# Patient Record
Sex: Female | Born: 1995 | Race: Black or African American | Hispanic: No | Marital: Single | Smoking: Never smoker
Health system: Southern US, Community
[De-identification: ages and names within clinical notes are randomized; demographics above are authoritative.]

## PROBLEM LIST (undated history)

## (undated) ENCOUNTER — Inpatient Hospital Stay (HOSPITAL_COMMUNITY): Payer: PRIVATE HEALTH INSURANCE

## (undated) DIAGNOSIS — O24419 Gestational diabetes mellitus in pregnancy, unspecified control: Secondary | ICD-10-CM

## (undated) DIAGNOSIS — E119 Type 2 diabetes mellitus without complications: Secondary | ICD-10-CM

## (undated) HISTORY — PX: WISDOM TOOTH EXTRACTION: SHX21

## (undated) HISTORY — PX: MOUTH SURGERY: SHX715

---

## 2015-06-28 ENCOUNTER — Encounter (HOSPITAL_COMMUNITY): Payer: Self-pay | Admitting: Emergency Medicine

## 2015-06-28 ENCOUNTER — Emergency Department (HOSPITAL_COMMUNITY)
Admission: EM | Admit: 2015-06-28 | Discharge: 2015-06-28 | Disposition: A | Discharge status: Home / Self Care | Payer: No Typology Code available for payment source | Attending: Emergency Medicine | Admitting: Emergency Medicine

## 2015-06-28 DIAGNOSIS — S199XXA Unspecified injury of neck, initial encounter: Secondary | ICD-10-CM | POA: Insufficient documentation

## 2015-06-28 DIAGNOSIS — M545 Low back pain, unspecified: Secondary | ICD-10-CM

## 2015-06-28 DIAGNOSIS — S3992XA Unspecified injury of lower back, initial encounter: Secondary | ICD-10-CM | POA: Insufficient documentation

## 2015-06-28 DIAGNOSIS — Y9389 Activity, other specified: Secondary | ICD-10-CM | POA: Insufficient documentation

## 2015-06-28 DIAGNOSIS — Y92410 Unspecified street and highway as the place of occurrence of the external cause: Secondary | ICD-10-CM | POA: Diagnosis not present

## 2015-06-28 DIAGNOSIS — Y998 Other external cause status: Secondary | ICD-10-CM | POA: Insufficient documentation

## 2015-06-28 DIAGNOSIS — S299XXA Unspecified injury of thorax, initial encounter: Secondary | ICD-10-CM | POA: Insufficient documentation

## 2015-06-28 MED ORDER — METHOCARBAMOL 500 MG PO TABS: 500.0000 mg | ORAL_TABLET | Freq: Two times a day (BID) | ORAL | Status: DC

## 2015-06-28 MED ORDER — IBUPROFEN 800 MG PO TABS: 800.0000 mg | ORAL_TABLET | Freq: Three times a day (TID) | ORAL | Status: DC
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
## 2015-06-28 NOTE — ED Notes (Signed)
Declined W/C at D/C and was escorted to lobby by RN. 
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
## 2018-06-22 ENCOUNTER — Ambulatory Visit: Payer: Self-pay | Admitting: Family Medicine

## 2018-06-29 ENCOUNTER — Other Ambulatory Visit: Payer: Self-pay | Admitting: Obstetrics and Gynecology

## 2018-06-29 DIAGNOSIS — R947 Abnormal results of other endocrine function studies: Secondary | ICD-10-CM

## 2018-07-07 ENCOUNTER — Ambulatory Visit
Admission: RE | Admit: 2018-07-07 | Discharge: 2018-07-07 | Disposition: A | Discharge status: Home / Self Care | Payer: PRIVATE HEALTH INSURANCE | Source: Ambulatory Visit | Attending: Obstetrics and Gynecology | Admitting: Obstetrics and Gynecology

## 2018-07-07 DIAGNOSIS — R947 Abnormal results of other endocrine function studies: Secondary | ICD-10-CM

## 2018-07-07 MED ORDER — GADOBENATE DIMEGLUMINE 529 MG/ML IV SOLN
7.0000 mL | Freq: Once | INTRAVENOUS | Status: AC | PRN
  Administered 2018-07-07: 7 mL via INTRAVENOUS

## 2019-07-21 IMAGING — MR MR HEAD WO/W CM
14 of 19 series · 34 of 48 positions shown · IV contrast (multihance)
Comparison: None.

CLINICAL DATA: Initial evaluation for elevated prolactin levels for
several years.

EXAM:
MRI HEAD WITHOUT AND WITH CONTRAST
TECHNIQUE: Multiplanar, multiecho pulse sequences of the brain and surrounding
structures were obtained without and with intravenous contrast. A
pituitary protocol was utilized.
CONTRAST:  7mL MULTIHANCE GADOBENATE DIMEGLUMINE 529 MG/ML IV SOLN

[Series 2: T1 · sagittal · 5.0mm · 0.45mm/px · 2 of 24 slices shown]
[im 1/24]
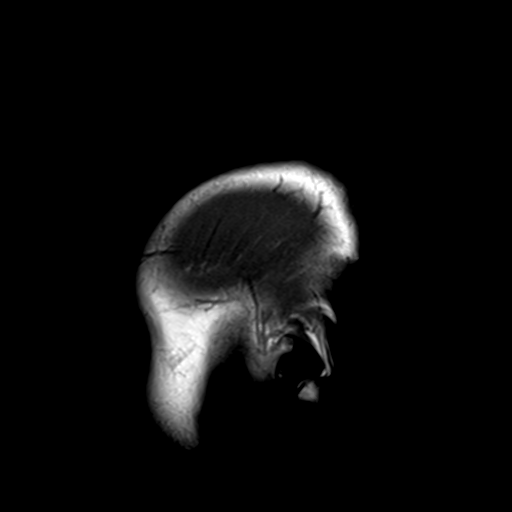
[im 24/24]
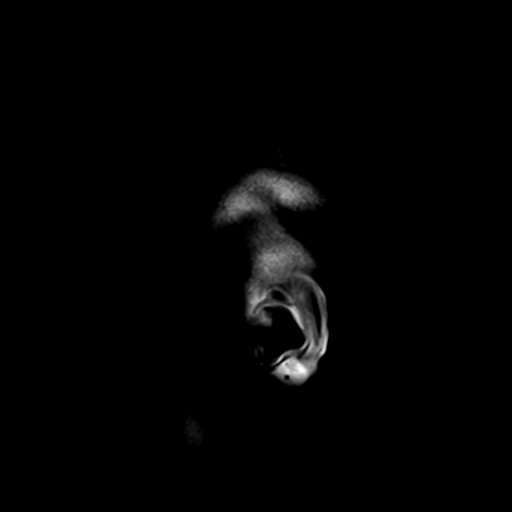

[Series 3: DWI · axial · 3.0mm · 1.80mm/px · z∈[-94,+51]mm · 11 of 100 slices shown]
[im 1/100]
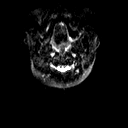
[im 10/100]
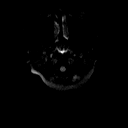
[im 20/100]
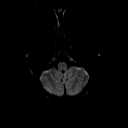
[im 30/100]
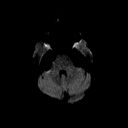
[im 40/100]
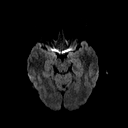
[im 50/100]
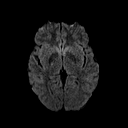
[im 60/100]
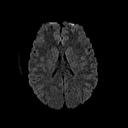
[im 70/100]
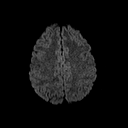
[im 80/100]
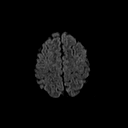
[im 90/100]
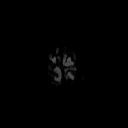
[im 100/100]
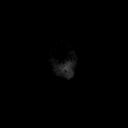

[Series 4: dwi_adc · axial · 3.0mm · 1.80mm/px · z∈[-94,+51]mm · 5 of 50 slices shown]
[im 1/50]
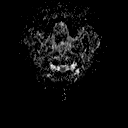
[im 13/50]
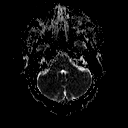
[im 25/50]
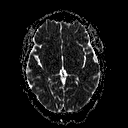
[im 37/50]
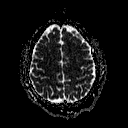
[im 50/50]
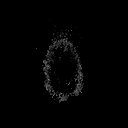

[Series 5: T2 · axial · 5.0mm · 0.36mm/px · z∈[-86,+49]mm · 2 of 22 slices shown]
[im 1/22]
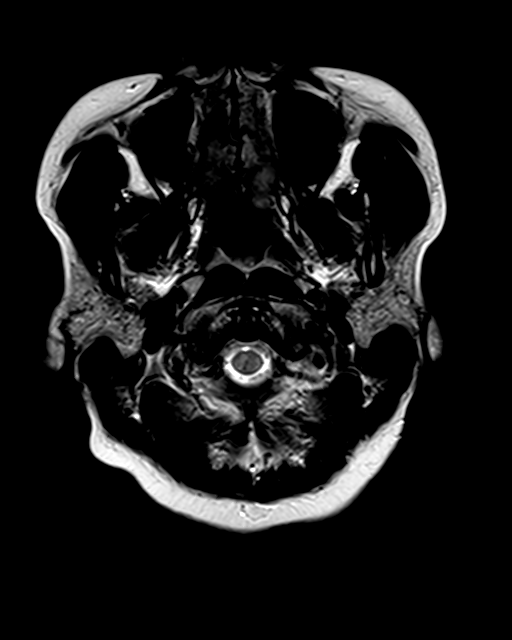
[im 22/22]
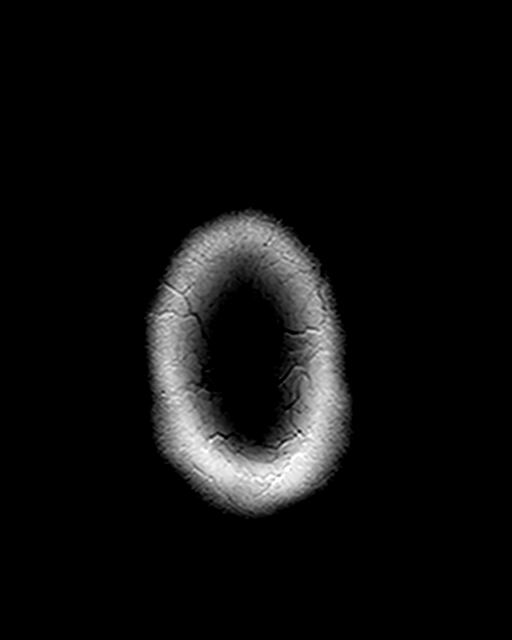

[Series 6: FLAIR · axial · 3.0mm · 0.45mm/px · z∈[-90,+52]mm · 3 of 32 slices shown]
[im 1/32]
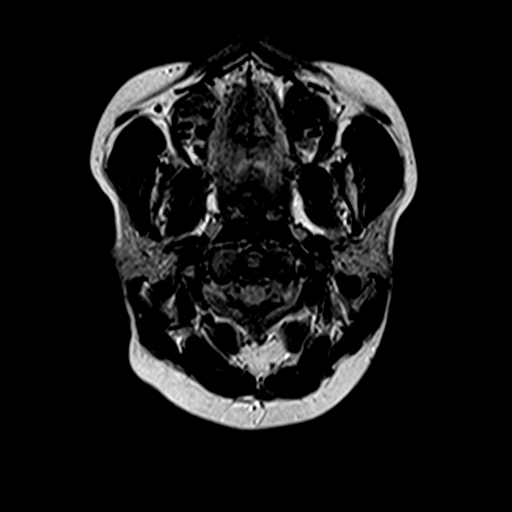
[im 16/32]
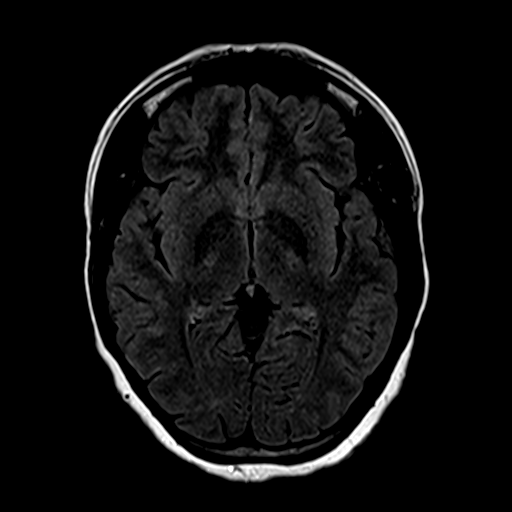
[im 32/32]
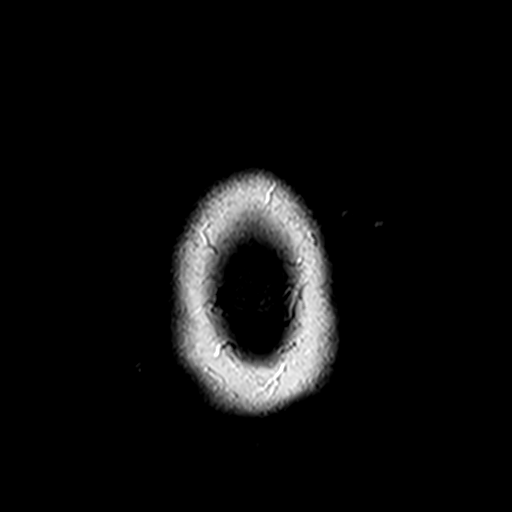

[Series 7: axial grad (blood) · axial · 5.0mm · 0.45mm/px · z∈[-92,+55]mm · 3 of 24 slices shown]
[im 1/24]
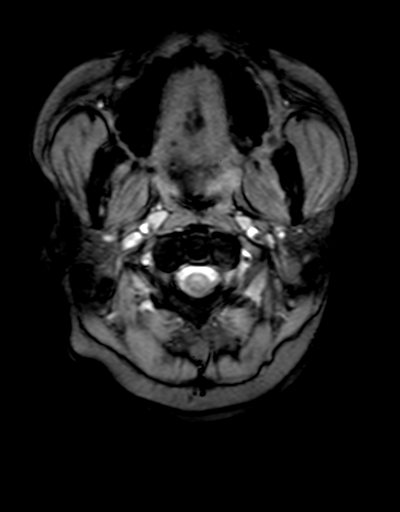
[im 12/24]
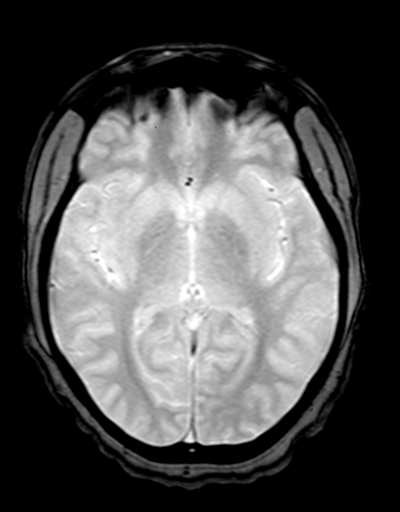
[im 24/24]
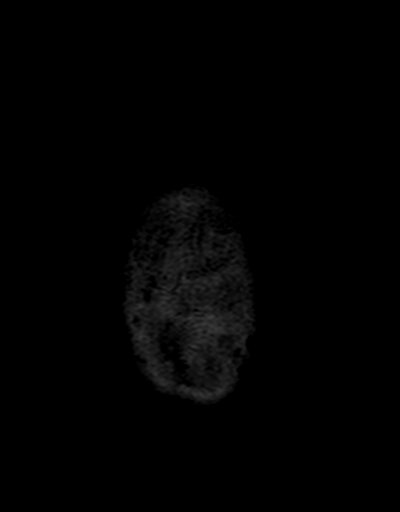

[Series 8: sag 3mm · sagittal · 3.0mm · 0.33mm/px · 1 of 11 slices shown]
[im 1/11]
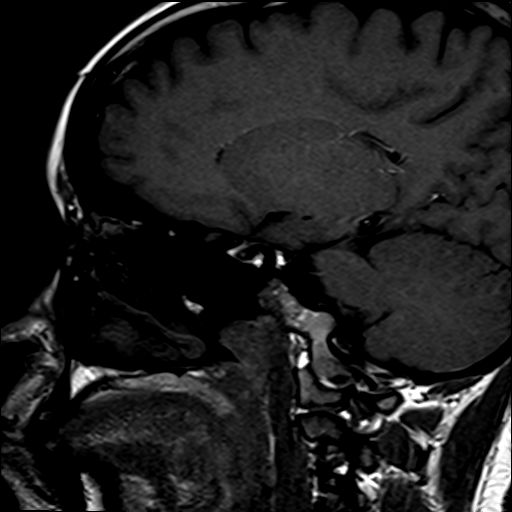

[Series 9: cor 3mm · coronal · 3.0mm · 0.33mm/px · 1 of 11 slices shown]
[im 1/11]
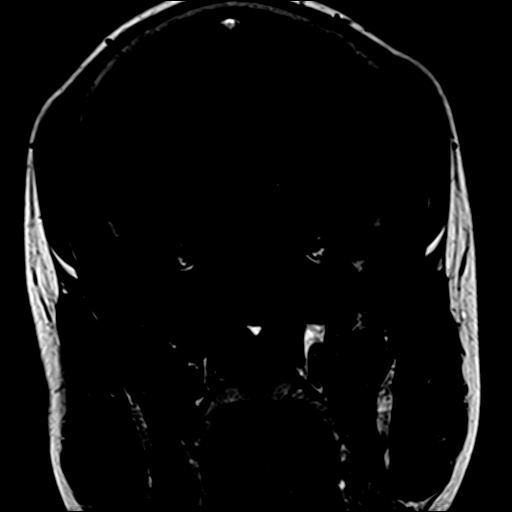

[Series 10: pre cor dynamic · coronal · non-contrast · 3.0mm · 0.35mm/px · 1 of 7 slices shown]
[im 1/7]
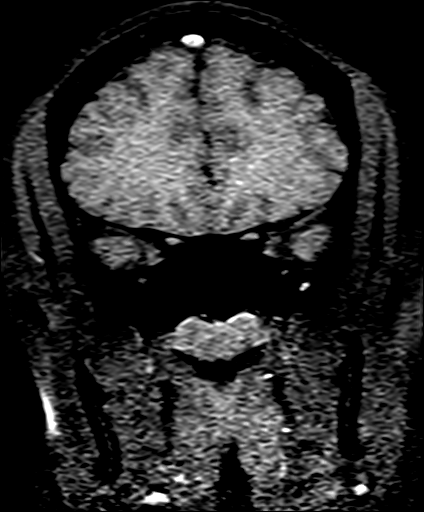

[Series 11: post fs cor · coronal · 3.0mm · 0.35mm/px · 1 of 7 slices shown (1 of 5)]
[im 1/7]
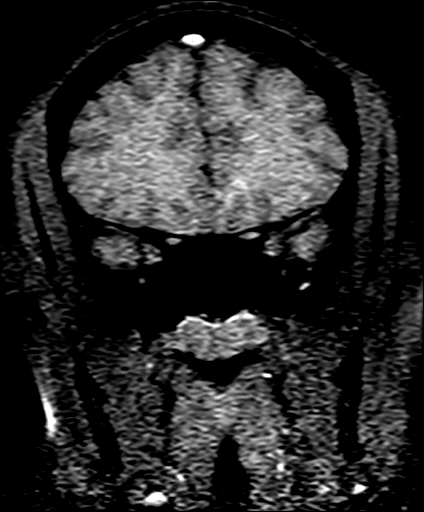

[Series 12: post fs cor · coronal · 3.0mm · 0.35mm/px · 1 of 7 slices shown (2 of 5)]
[im 1/7]
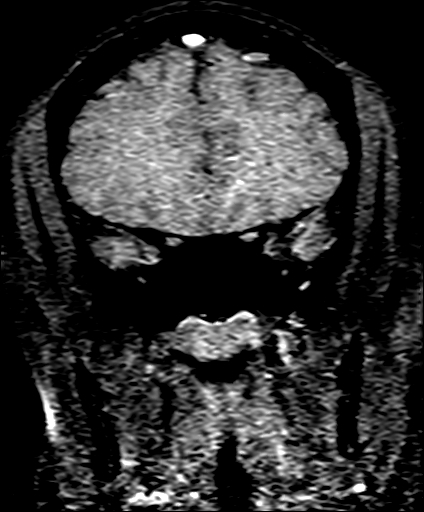

[Series 13: post fs cor · coronal · 3.0mm · 0.35mm/px · 1 of 7 slices shown (3 of 5)]
[im 1/7]
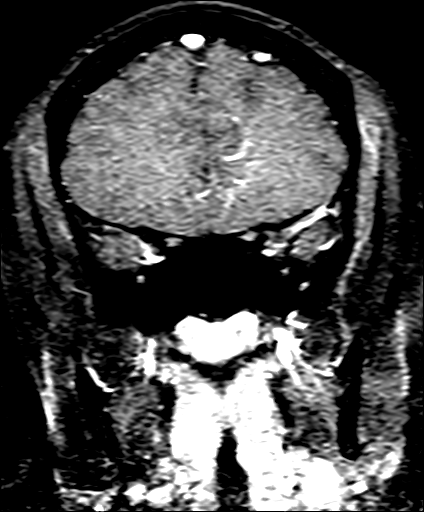

[Series 14: post fs cor · coronal · 3.0mm · 0.35mm/px · 1 of 7 slices shown (4 of 5)]
[im 1/7]
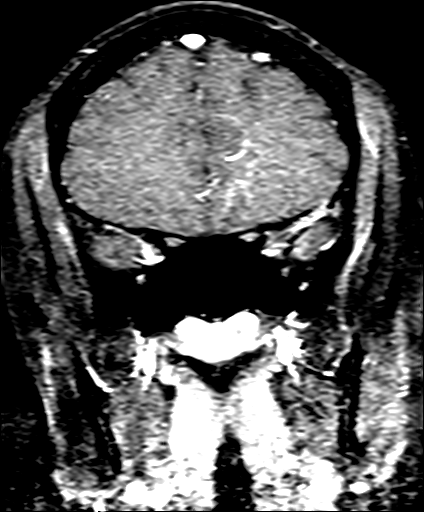

[Series 15: post fs cor · coronal · 3.0mm · 0.35mm/px · 1 of 7 slices shown (5 of 5)]
[im 1/7]
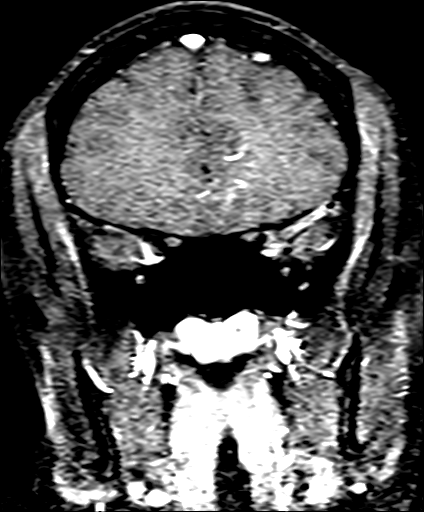

[34 of 48 positions shown; findings below may reference images not displayed]

FINDINGS: Brain: Cerebral volume normal for patient age. No focal parenchymal
signal abnormality identified. No evidence for acute or subacute
infarct. Gray-white matter differentiation well maintained. No
encephalomalacia to suggest chronic infarction. No foci of
susceptibility artifact to suggest acute or chronic intracranial
hemorrhage.

No mass lesion, midline shift or mass effect. No hydrocephalus. No
extra-axial fluid collection. No abnormal enhancement within the
brain.

Postcontrast dynamic imaging through the pituitary gland and sella
was performed. Pituitary bright spot normally position. Pituitary
gland itself demonstrates normal size and morphology. No areas of
relative hypoenhancement to suggest adenoma. Pituitary stalk midline
and intact. Optic chiasm normally situated within the suprasellar
cistern. No abnormality within the adjacent cavernous sinus.

Vascular: Normal intravascular flow voids seen within the
intracranial circulation.

Skull and upper cervical spine: Craniocervical junction normal.
Visual upper cervical spine unremarkable. Bone marrow signal
intensity within normal limits. No scalp soft tissue abnormality.

Sinuses/Orbits: Globes and orbital soft tissues within normal
limits. Paranasal sinuses and mastoid air cells are clear. Inner ear
structures grossly normal.

Other: None.
IMPRESSION: Normal pituitary protocol MRI of the brain.

## 2019-07-26 ENCOUNTER — Other Ambulatory Visit: Payer: Self-pay

## 2019-07-26 DIAGNOSIS — Z20822 Contact with and (suspected) exposure to covid-19: Secondary | ICD-10-CM

## 2019-07-27 LAB — NOVEL CORONAVIRUS, NAA: SARS-CoV-2, NAA: NOT DETECTED

## 2019-08-22 ENCOUNTER — Other Ambulatory Visit: Payer: Self-pay | Admitting: *Deleted

## 2020-07-16 LAB — OB RESULTS CONSOLE HEPATITIS B SURFACE ANTIGEN: Hepatitis B Surface Ag: NEGATIVE

## 2020-07-16 LAB — OB RESULTS CONSOLE GC/CHLAMYDIA
Chlamydia: NEGATIVE
Gonorrhea: NEGATIVE

## 2020-07-16 LAB — OB RESULTS CONSOLE HIV ANTIBODY (ROUTINE TESTING): HIV: NONREACTIVE

## 2020-07-16 LAB — OB RESULTS CONSOLE ABO/RH: RH Type: POSITIVE

## 2020-07-16 LAB — OB RESULTS CONSOLE RUBELLA ANTIBODY, IGM: Rubella: IMMUNE

## 2020-07-16 LAB — OB RESULTS CONSOLE ANTIBODY SCREEN: Antibody Screen: NEGATIVE

## 2020-07-16 LAB — OB RESULTS CONSOLE RPR: RPR: NONREACTIVE

## 2020-09-04 ENCOUNTER — Inpatient Hospital Stay (HOSPITAL_COMMUNITY)
Admission: AD | Admit: 2020-09-04 | Discharge: 2020-09-05 | Disposition: A | Discharge status: Home / Self Care | Payer: BC Managed Care – PPO | Attending: Obstetrics & Gynecology | Admitting: Obstetrics & Gynecology

## 2020-09-04 ENCOUNTER — Other Ambulatory Visit: Payer: Self-pay

## 2020-09-04 DIAGNOSIS — J029 Acute pharyngitis, unspecified: Secondary | ICD-10-CM | POA: Diagnosis present

## 2020-09-04 DIAGNOSIS — Z3A13 13 weeks gestation of pregnancy: Secondary | ICD-10-CM | POA: Diagnosis not present

## 2020-09-04 DIAGNOSIS — O98511 Other viral diseases complicating pregnancy, first trimester: Secondary | ICD-10-CM | POA: Insufficient documentation

## 2020-09-04 DIAGNOSIS — U071 COVID-19: Secondary | ICD-10-CM | POA: Insufficient documentation

## 2020-09-04 LAB — CBC
HCT: 32.2 % — ABNORMAL LOW (ref 36.0–46.0)
Hemoglobin: 10.9 g/dL — ABNORMAL LOW (ref 12.0–15.0)
MCH: 28.2 pg (ref 26.0–34.0)
MCHC: 33.9 g/dL (ref 30.0–36.0)
MCV: 83.2 fL (ref 80.0–100.0)
Platelets: 274 10*3/uL (ref 150–400)
RBC: 3.87 MIL/uL (ref 3.87–5.11)
RDW: 13 % (ref 11.5–15.5)
WBC: 5.8 10*3/uL (ref 4.0–10.5)
nRBC: 0 % (ref 0.0–0.2)

## 2020-09-04 LAB — COMPREHENSIVE METABOLIC PANEL
ALT: 16 U/L (ref 0–44)
AST: 20 U/L (ref 15–41)
Albumin: 3.5 g/dL (ref 3.5–5.0)
Alkaline Phosphatase: 48 U/L (ref 38–126)
Anion gap: 9 (ref 5–15)
BUN: 5 mg/dL — ABNORMAL LOW (ref 6–20)
CO2: 22 mmol/L (ref 22–32)
Calcium: 9.4 mg/dL (ref 8.9–10.3)
Chloride: 102 mmol/L (ref 98–111)
Creatinine, Ser: 0.63 mg/dL (ref 0.44–1.00)
GFR, Estimated: >60 mL/min (ref >60)
Glucose, Bld: 98 mg/dL (ref 70–99)
Potassium: 3.8 mmol/L (ref 3.5–5.1)
Sodium: 133 mmol/L — ABNORMAL LOW (ref 135–145)
Total Bilirubin: 0.1 mg/dL — ABNORMAL LOW (ref 0.3–1.2)
Total Protein: 7.1 g/dL (ref 6.5–8.1)

## 2020-09-04 LAB — URINALYSIS, ROUTINE W REFLEX MICROSCOPIC
Bilirubin Urine: NEGATIVE
Glucose, UA: NEGATIVE mg/dL
Hgb urine dipstick: NEGATIVE
Ketones, ur: NEGATIVE mg/dL
Leukocytes,Ua: NEGATIVE
Nitrite: NEGATIVE
Protein, ur: NEGATIVE mg/dL
Specific Gravity, Urine: 1.006 (ref 1.005–1.030)
pH: 6 (ref 5.0–8.0)

## 2020-09-04 NOTE — MAU Note (Addendum)
PT SAYS LMP WAS 9-23- WAS CONFIRMED AT [facility] - NEXT APPOINTMENT 09-13-2020.    SAYS BACK HURTS- STARTED TODAY -DID NOT CALL DR.   SAYS THROAT HURTS, H/A , DIZZY - FEELS HOT.

## 2020-09-05 DIAGNOSIS — O98511 Other viral diseases complicating pregnancy, first trimester: Secondary | ICD-10-CM

## 2020-09-05 DIAGNOSIS — Z3A13 13 weeks gestation of pregnancy: Secondary | ICD-10-CM

## 2020-09-05 DIAGNOSIS — U071 COVID-19: Secondary | ICD-10-CM | POA: Diagnosis not present

## 2020-09-05 LAB — RESP PANEL BY RT-PCR (FLU A&B, COVID) ARPGX2
Influenza A by PCR: NEGATIVE
Influenza B by PCR: NEGATIVE
SARS Coronavirus 2 by RT PCR: POSITIVE — AB

## 2020-09-05 MED ORDER — DIPHENHYDRAMINE HCL 50 MG/ML IJ SOLN: 50.0000 mg | Freq: Once | INTRAMUSCULAR | Status: DC | PRN

## 2020-09-05 MED ORDER — FAMOTIDINE 20 MG IN NS 100 ML IVPB: 20.0000 mg | Freq: Once | INTRAVENOUS | Status: DC | PRN

## 2020-09-05 MED ORDER — EPINEPHRINE 0.3 MG/0.3ML IJ SOAJ
0.3000 mg | Freq: Once | INTRAMUSCULAR | Status: DC | PRN
  Filled 2020-09-05: qty 0.6

## 2020-09-05 MED ORDER — SODIUM CHLORIDE 0.9 % IV SOLN: INTRAVENOUS | Status: DC | PRN

## 2020-09-05 MED ORDER — SODIUM CHLORIDE 0.9 % IV SOLN
Freq: Once | INTRAVENOUS | Status: AC
  Filled 2020-09-05: qty 5

## 2020-09-05 MED ORDER — ALBUTEROL SULFATE HFA 108 (90 BASE) MCG/ACT IN AERS
2.0000 | INHALATION_SPRAY | Freq: Once | RESPIRATORY_TRACT | Status: DC | PRN
  Filled 2020-09-05: qty 6.7

## 2020-09-05 MED ORDER — METHYLPREDNISOLONE SODIUM SUCC 125 MG IJ SOLR: 125.0000 mg | Freq: Once | INTRAMUSCULAR | Status: DC | PRN

## 2020-09-05 NOTE — MAU Provider Note (Signed)
History     CSN: [id number]  Arrival date and time: 09/04/20 2011   Event Date/Time   First Provider Initiated Contact with Patient 09/05/20 0224      Chief Complaint  Patient presents with  . Sore Throat  . Dizziness   Amy Valenzuela is a 24 y.o. G1P0 at [redacted]w[redacted]d who presents to MAU with complaints of HA, back pain, sore throat and lightheadedness. Patient reports symptoms started occurring yesterday. Rates HA 10/10- has taken Tylenol for HA without relief. Denies any abdominal pain, vaginal bleeding, or discharge. Denies shortness of breath. Patient reports that she was vaccinated last dose June 2021.    OB History    Gravida  1   Para      Term      Preterm      AB      Living        SAB      IAB      Ectopic      Multiple      Live Births              No past medical history on file.  No past surgical history on file.  No family history on file.  Social History   Tobacco Use  . Smoking status: Never Smoker  Substance Use Topics  . Alcohol use: No    Allergies: Not on File  Medications Prior to Admission  Medication Sig Dispense Refill Last Dose  . ibuprofen (ADVIL,MOTRIN) 800 MG tablet Take 1 tablet (800 mg total) by mouth 3 (three) times daily. 21 tablet 0   . methocarbamol (ROBAXIN) 500 MG tablet Take 1 tablet (500 mg total) by mouth 2 (two) times daily. 20 tablet 0     Review of Systems  Constitutional: Negative.   HENT: Positive for sore throat. Negative for congestion.   Respiratory: Negative.   Cardiovascular: Negative.   Gastrointestinal: Negative.   Genitourinary: Negative.   Musculoskeletal: Negative.   Neurological: Positive for dizziness, light-headedness and headaches.       Near syncope   Physical Exam   Blood pressure 122/71, pulse (!) 111, temperature 100.1 F (37.8 C), temperature source Oral, resp. rate 18, height 5\' 2"  (1.575 m), weight 80.1 kg, last menstrual period 05/31/2020.  Physical Exam Vitals and nursing  note reviewed.  Constitutional:      General: She is not in acute distress.    Appearance: Normal appearance. She is normal weight.  HENT:     Head: Normocephalic.  Cardiovascular:     Rate and Rhythm: Normal rate and regular rhythm.  Pulmonary:     Effort: Pulmonary effort is normal. No respiratory distress.     Breath sounds: Normal breath sounds. No wheezing.  Abdominal:     Palpations: Abdomen is soft. There is no mass.     Tenderness: There is no abdominal tenderness. There is no guarding.  Skin:    General: Skin is warm and dry.  Neurological:     Mental Status: She is alert and oriented to person, place, and time.  Psychiatric:        Mood and Affect: Mood normal.        Behavior: Behavior normal.        Thought Content: Thought content normal.    FHR 169 by doppler   MAU Course  Procedures  MDM Orders Placed This Encounter  Procedures  . Resp Panel by RT-PCR (Flu A&B, Covid) Nasopharyngeal Swab  . Urinalysis, Routine w  reflex microscopic Urine, Clean Catch  . CBC  . Comprehensive metabolic panel  . Orthostatic vital signs  . Hypersensitivity GRADE 1: Transient flushing or rash, or drug fever < 100.4 F  . Hypersensitivity GRADE 2: Rash, flushing, urticaria, dyspnea, or drug fever = or > 100.4 F  . Hypersensitivity GRADE 3: symptomatic bronchospasm, with or without urticaria, parenteral medication management indicated, allergy-related edema/angioedema, or hypotension  . Hypersensitivity GRADE 4: Anaphylaxis  . Fetal monitoring  . Assess fetal heart tones  . Provide patient the appropriate monoclonal antibody fact sheet prior to administration  . casirivimab / imdevimab per pharmacy consult  . Airborne and Contact precautions  . Insert peripheral IV   After diagnoses of COVID and discussion of results with patient- patient request ultrasound to "check on baby"  Bedside US performed - Pt informed that the ultrasound is considered a limited OB ultrasound and is not  intended to be a complete ultrasound exam.  Patient also informed that the ultrasound is not being completed with the intent of assessing for fetal or placental anomalies or any pelvic abnormalities.  Explained that the purpose of today's ultrasound is to assess for  viability.  Patient acknowledges the purpose of the exam and the limitations of the study.    Fetal movement and heart beat showed to patient. Patient thanks provider for comfort.   Educated and discussed COVID diagnoses and option of receiving mAb infusion in MAU prior to discharge home. Discussed risks and benefits of infusion. Patient agrees to mAb and orders for administration placed.   Meds ordered this encounter  Medications  . casirivimab (REGN 10933) 600 mg, imdevimab (REGN 10987) 600 mg in sodium chloride 0.9 % 110 mL IVPB    Order Specific Question:   I attest that this patient meets the FDA Emergency Use Authorization criteria and has risk factor(s) for progression to severe COVID-19 and/or hospitalization:    Answer:   Yes    Order Specific Question:   High Risk Factor(s) for COVID-19 Progression:    Answer:   Pregnancy  . 0.9 %  sodium chloride infusion  . diphenhydrAMINE (BENADRYL) injection 50 mg  . famotidine (PEPCID) IVPB 20 mg in NS 100 mL IVPB  . methylPREDNISolone sodium succinate (SOLU-MEDROL) 125 mg/2 mL injection 125 mg  . albuterol (VENTOLIN HFA) 108 (90 Base) MCG/ACT inhaler 2 puff  . EPINEPHrine (EPI-PEN) injection 0.3 mg   Patient reassessed after mAb - patient denies HA at this time, no adverse reaction. Educated and discussed with patient to have friends/family that she has been in contact with tested for COVID. Quarantine for 10 days. Discussed safe medications during pregnancy.   Discussed reasons to return to MAU. Return to MAU as needed. Pt stable at time of discharge.   Assessment and Plan   1. COVID-19 affecting pregnancy in first trimester   2. [redacted] weeks gestation of pregnancy    Discharge  home Make initial prenatal appointment  Hydration, rest, and quarantine  Return to MAU as needed for reasons discussed and/or emergencies     Allergies as of 09/05/2020   No Known Allergies     Medication List    STOP taking these medications   ibuprofen 800 MG tablet Commonly known as: ADVIL   methocarbamol 500 MG tablet Commonly known as: ROBAXIN       Sharyon Cable CNM 09/05/2020, 7:08 AM

## 2020-09-05 NOTE — Discharge Instructions (Signed)
Safe Medications in Pregnancy   Backache/Headache: Tylenol: 2 regular strength every 4 hours OR              2 Extra strength every 6 hours  Colds/Coughs/Allergies: Benadryl (alcohol free) 25 mg every 6 hours as needed Breath right strips Claritin Cepacol throat lozenges Chloraseptic throat spray Cold-Eeze- up to three times per day Cough drops, alcohol free Flonase (by prescription only) Guaifenesin Mucinex Robitussin DM (plain only, alcohol free) Saline nasal spray/drops Sudafed (pseudoephedrine) & Actifed ** use only after [redacted] weeks gestation and if you do not have high blood pressure Tylenol Vicks Vaporub Zinc lozenges Zyrtec   Constipation: Colace Ducolax suppositories Fleet enema Glycerin suppositories Metamucil Milk of magnesia Miralax Senokot Smooth move tea  Diarrhea: Kaopectate Imodium A-D  *NO pepto Bismol  Nausea/Vomiting:  Bonine Dramamine Emetrol Ginger extract Sea bands Meclizine  Nausea medication to take during pregnancy:  Unisom (doxylamine succinate 25 mg tablets) Take one tablet daily at bedtime. If symptoms are not adequately controlled, the dose can be increased to a maximum recommended dose of two tablets daily (1/2 tablet in the morning, 1/2 tablet mid-afternoon and one at bedtime). Vitamin B6 100mg  tablets. Take one tablet twice a day (up to 200 mg per day).  **If taking multiple medications, please check labels to avoid duplicating the same active ingredients **take medication as directed on the label ** Do not exceed 4000 mg of tylenol in 24 hours **Do not take medications that contain aspirin or ibuprofen   10 Things You Can Do to Manage Your COVID-19 Symptoms at Home If you have possible or confirmed COVID-19: 1. Stay home from work and school. And stay away from other public places. If you must go out, avoid using any kind of public transportation, ridesharing, or taxis. 2. Monitor your symptoms carefully. If your symptoms get  worse, call your healthcare provider immediately. 3. Get rest and stay hydrated. 4. If you have a medical appointment, call the healthcare provider ahead of time and tell them that you have or may have COVID-19. 5. For medical emergencies, call 911 and notify the dispatch personnel that you have or may have COVID-19. 6. Cover your cough and sneezes with a tissue or use the inside of your elbow. 7. Wash your hands often with soap and water for at least 20 seconds or clean your hands with an alcohol-based hand sanitizer that contains at least 60% alcohol. 8. As much as possible, stay in a specific room and away from other people in your home. Also, you should use a separate bathroom, if available. If you need to be around other people in or outside of the home, wear a mask. 9. Avoid sharing personal items with other people in your household, like dishes, towels, and bedding. 10. Clean all surfaces that are touched often, like counters, tabletops, and doorknobs. Use household cleaning sprays or wipes according to the label instructions. 03/09/2019 This information is not intended to replace advice given to you by your health care provider. Make sure you discuss any questions you have with your health care provider. Document Revised: 08/11/2019 Document Reviewed: 08/11/2019 Elsevier Patient Education  2020 14/11/2018.

## 2021-01-16 ENCOUNTER — Other Ambulatory Visit: Payer: Self-pay

## 2021-01-16 ENCOUNTER — Encounter: Payer: BC Managed Care – PPO | Attending: Obstetrics and Gynecology | Admitting: Registered"

## 2021-01-16 DIAGNOSIS — O24419 Gestational diabetes mellitus in pregnancy, unspecified control: Secondary | ICD-10-CM | POA: Diagnosis present

## 2021-01-18 ENCOUNTER — Encounter: Payer: Self-pay | Admitting: Registered"

## 2021-01-18 DIAGNOSIS — O24419 Gestational diabetes mellitus in pregnancy, unspecified control: Secondary | ICD-10-CM | POA: Insufficient documentation

## 2021-01-18 NOTE — Progress Notes (Signed)
Patient was seen on 01/16/2021 for Gestational Diabetes self-management class [facility]. The following learning objectives were met by the patient during this course:   States the definition of Gestational Diabetes  States why dietary management is important in controlling blood glucose  Describes the effects each nutrient has on blood glucose levels  Demonstrates ability to create a balanced meal plan  Demonstrates carbohydrate counting   States when to check blood glucose levels  Demonstrates proper blood glucose monitoring techniques  States the effect of stress and exercise on blood glucose levels  States the importance of limiting caffeine and abstaining from alcohol and smoking  Blood glucose monitor given: Accu-chek Guide Me Lot #[id number] Exp: 01/11/2022 CBG: 93 mg/dL  Patient instructed to monitor glucose levels: FBS: 60 - <95; 1 hour: <140; 2 hour: <120  Patient received handouts:  Nutrition Diabetes and Pregnancy, including carb counting list  Patient will be seen for follow-up as needed.

## 2021-01-24 ENCOUNTER — Inpatient Hospital Stay (HOSPITAL_BASED_OUTPATIENT_CLINIC_OR_DEPARTMENT_OTHER): Payer: BC Managed Care – PPO

## 2021-01-24 ENCOUNTER — Other Ambulatory Visit: Payer: Self-pay

## 2021-01-24 ENCOUNTER — Inpatient Hospital Stay (HOSPITAL_COMMUNITY)
Admission: AD | Admit: 2021-01-24 | Discharge: 2021-01-24 | Disposition: A | Discharge status: Home / Self Care | Payer: BC Managed Care – PPO | Attending: Obstetrics & Gynecology | Admitting: Obstetrics & Gynecology

## 2021-01-24 ENCOUNTER — Encounter (HOSPITAL_COMMUNITY): Payer: Self-pay | Admitting: Obstetrics & Gynecology

## 2021-01-24 DIAGNOSIS — Z3A34 34 weeks gestation of pregnancy: Secondary | ICD-10-CM | POA: Diagnosis not present

## 2021-01-24 DIAGNOSIS — Z3689 Encounter for other specified antenatal screening: Secondary | ICD-10-CM

## 2021-01-24 DIAGNOSIS — O24419 Gestational diabetes mellitus in pregnancy, unspecified control: Secondary | ICD-10-CM | POA: Insufficient documentation

## 2021-01-24 DIAGNOSIS — O288 Other abnormal findings on antenatal screening of mother: Secondary | ICD-10-CM | POA: Diagnosis not present

## 2021-01-24 HISTORY — DX: Type 2 diabetes mellitus without complications: E11.9

## 2021-01-24 NOTE — MAU Provider Note (Addendum)
  History     CSN: [id number]  Arrival date and time: 01/24/21 1045   Event Date/Time   First Provider Initiated Contact with Patient 01/24/21 1219      Chief Complaint  Patient presents with  . Fetal Monitoring   HPI  Ms.Amy Valenzuela is a 25 y.o. female G1P0 @ [redacted]w[redacted]d here in MAU for further fetal monitoring. She was seen in the office today at [facility] and had an NST done which was non-reactive. She was sent here for longer monitoring. She reports good/ normal fetal movement currently.  Hx of GDM, she was started on oral medication today. She has not started this medication yet. No abdominal pain or vaginal bleeding.  She has no complaints.   OB History    Gravida  1   Para      Term      Preterm      AB      Living        SAB      IAB      Ectopic      Multiple      Live Births              Past Medical History:  Diagnosis Date  . Diabetes mellitus without complication [facility])     Past Surgical History:  Procedure Laterality Date  . MOUTH SURGERY    . WISDOM TOOTH EXTRACTION      No family history on file.  Social History   Tobacco Use  . Smoking status: Never Smoker  . Smokeless tobacco: Never Used  Substance Use Topics  . Alcohol use: No  . Drug use: Never    Allergies: No Known Allergies  Medications Prior to Admission  Medication Sig Dispense Refill Last Dose  . glipiZIDE (GLUCOTROL) 5 MG tablet Take 5 mg by mouth daily before breakfast.     . Prenatal Vit-Fe Fumarate-FA (PRENATAL MULTIVITAMIN) TABS tablet Take 1 tablet by mouth daily at 12 noon.   01/23/2021 at Unknown time   No results found for this or any previous visit (from the past 48 hour(s)).   No results found.  Review of Systems  Gastrointestinal: Negative for abdominal pain.  Genitourinary: Negative for vaginal bleeding and vaginal discharge.   Physical Exam   Blood pressure 121/68, pulse (!) 101, temperature 98 F (36.7 C), temperature source Oral, resp. rate 19, height  5\' 2"  (1.575 m), weight 90.6 kg, last menstrual period 05/31/2020, SpO2 99 %.  Physical Exam Constitutional:      General: She is not in acute distress.    Appearance: Normal appearance. She is not ill-appearing, toxic-appearing or diaphoretic.  HENT:     Head: Normocephalic.  Eyes:     Pupils: Pupils are equal, round, and reactive to light.  Neurological:     Mental Status: She is alert and oriented to person, place, and time.   Fetal Tracing: Baseline: 140 bpm Variability: Moderate  Accelerations: 15x15 Decelerations: None Toco: None MAU Course  Procedures  MDM  BPP 8/8 with reactive NST 10/10 Reviewed Fetal tracing with Dr. 06/02/2020    Assessment and Plan    A:  1. NST (non-stress test) reactive   2. Non-reactive NST (non-stress test)   3. [redacted] weeks gestation of pregnancy     P:  Discharge home in stable condition Kick counts reviewed F/u with [facility] Return to MAU if symptoms worsen   Ajdin Macke, March [place], NP 01/24/2021 1:45 PM

## 2021-01-24 NOTE — Discharge Instructions (Signed)
Fetal Movement Counts Patient Name: ________________________________________________ Patient Due Date: ____________________  What is a fetal movement count? A fetal movement count is the number of times that you feel your baby move during a certain amount of time. This may also be called a fetal kick count. A fetal movement count is recommended for every pregnant woman. You may be asked to start counting fetal movements as early as week 28 of your pregnancy. Pay attention to when your baby is most active. You may notice your baby's sleep and wake cycles. You may also notice things that make your baby move more. You should do a fetal movement count:  When your baby is normally most active.  At the same time each day. A good time to count movements is while you are resting, after having something to eat and drink. How do I count fetal movements? 1. Find a quiet, comfortable area. Sit, or lie down on your side. 2. Write down the date, the start time and stop time, and the number of movements that you felt between those two times. Take this information with you to your health care visits. 3. Write down your start time when you feel the first movement. 4. Count kicks, flutters, swishes, rolls, and jabs. You should feel at least 10 movements. 5. You may stop counting after you have felt 10 movements, or if you have been counting for 2 hours. Write down the stop time. 6. If you do not feel 10 movements in 2 hours, contact your health care provider for further instructions. Your health care provider may want to do additional tests to assess your baby's well-being. Contact a health care provider if:  You feel fewer than 10 movements in 2 hours.  Your baby is not moving like he or she usually does. Date: ____________ Start time: ____________ Stop time: ____________ Movements: ____________ Date: ____________ Start time: ____________ Stop time: ____________ Movements: ____________ Date: ____________  Start time: ____________ Stop time: ____________ Movements: ____________ Date: ____________ Start time: ____________ Stop time: ____________ Movements: ____________ Date: ____________ Start time: ____________ Stop time: ____________ Movements: ____________ Date: ____________ Start time: ____________ Stop time: ____________ Movements: ____________ Date: ____________ Start time: ____________ Stop time: ____________ Movements: ____________ Date: ____________ Start time: ____________ Stop time: ____________ Movements: ____________ Date: ____________ Start time: ____________ Stop time: ____________ Movements: ____________ This information is not intended to replace advice given to you by your health care provider. Make sure you discuss any questions you have with your health care provider. Document Revised: 04/14/2019 Document Reviewed: 04/14/2019 Elsevier Patient Education  2021 Elsevier Inc.  

## 2021-01-24 NOTE — MAU Note (Signed)
Presents with for fetal monitoring, reports had NST @ office today for GDM.  Denies VB or LOF.  Endorses +FM.

## 2021-01-25 DIAGNOSIS — Z3A34 34 weeks gestation of pregnancy: Secondary | ICD-10-CM | POA: Diagnosis not present

## 2021-01-25 DIAGNOSIS — O321XX0 Maternal care for breech presentation, not applicable or unspecified: Secondary | ICD-10-CM | POA: Diagnosis not present

## 2021-01-25 DIAGNOSIS — O288 Other abnormal findings on antenatal screening of mother: Secondary | ICD-10-CM

## 2021-02-11 ENCOUNTER — Other Ambulatory Visit: Payer: Self-pay | Admitting: Obstetrics and Gynecology

## 2021-02-21 NOTE — Patient Instructions (Signed)
Amy Valenzuela  02/21/2021   Your procedure is scheduled on:  03/01/2021  Arrive at 0730 at Entrance C on [employer] at [facility]  and [employer]. You are invited to use the FREE valet parking or use the Visitor's parking deck.  Pick up the phone at the desk and dial [phone].  Call this number if you have problems the morning of surgery: [phone]  Remember:   Do not eat food:(After Midnight) Desps de medianoche.  Do not drink clear liquids: (After Midnight) Desps de medianoche.  Take these medicines the morning of surgery with A SIP OF WATER:  none   Do not wear jewelry, make-up or nail polish.  Do not wear lotions, powders, or perfumes. Do not wear deodorant.  Do not shave 48 hours prior to surgery.  Do not bring valuables to the hospital.  [facility] is not   responsible for any belongings or valuables brought to the hospital.  Contacts, dentures or bridgework may not be worn into surgery.  Leave suitcase in the car. After surgery it may be brought to your room.  For patients admitted to the hospital, checkout time is 11:00 AM the day of              discharge.      Please read over the following fact sheets that you were given:     Preparing for Surgery

## 2021-02-22 ENCOUNTER — Encounter (HOSPITAL_COMMUNITY): Payer: Self-pay

## 2021-02-27 ENCOUNTER — Encounter (HOSPITAL_COMMUNITY)
Admission: RE | Admit: 2021-02-27 | Discharge: 2021-02-27 | Disposition: A | Discharge status: Home / Self Care | Payer: BC Managed Care – PPO | Source: Ambulatory Visit | Attending: Obstetrics and Gynecology | Admitting: Obstetrics and Gynecology

## 2021-02-27 ENCOUNTER — Other Ambulatory Visit: Payer: Self-pay

## 2021-02-27 ENCOUNTER — Other Ambulatory Visit (HOSPITAL_COMMUNITY)
Admission: RE | Admit: 2021-02-27 | Discharge: 2021-02-27 | Disposition: A | Discharge status: Home / Self Care | Payer: BC Managed Care – PPO | Source: Ambulatory Visit | Attending: Obstetrics and Gynecology | Admitting: Obstetrics and Gynecology

## 2021-02-27 DIAGNOSIS — Z20822 Contact with and (suspected) exposure to covid-19: Secondary | ICD-10-CM | POA: Insufficient documentation

## 2021-02-27 DIAGNOSIS — Z01812 Encounter for preprocedural laboratory examination: Secondary | ICD-10-CM | POA: Insufficient documentation

## 2021-02-27 HISTORY — DX: Gestational diabetes mellitus in pregnancy, unspecified control: O24.419

## 2021-02-27 LAB — BASIC METABOLIC PANEL
Anion gap: 9 (ref 5–15)
BUN: <5 mg/dL — ABNORMAL LOW (ref 6–20)
CO2: 21 mmol/L — ABNORMAL LOW (ref 22–32)
Calcium: 8.9 mg/dL (ref 8.9–10.3)
Chloride: 106 mmol/L (ref 98–111)
Creatinine, Ser: 0.62 mg/dL (ref 0.44–1.00)
GFR, Estimated: >60 mL/min (ref >60)
Glucose, Bld: 114 mg/dL — ABNORMAL HIGH (ref 70–99)
Potassium: 3.4 mmol/L — ABNORMAL LOW (ref 3.5–5.1)
Sodium: 136 mmol/L (ref 135–145)

## 2021-02-27 LAB — CBC
HCT: 34.5 % — ABNORMAL LOW (ref 36.0–46.0)
Hemoglobin: 11.9 g/dL — ABNORMAL LOW (ref 12.0–15.0)
MCH: 28.5 pg (ref 26.0–34.0)
MCHC: 34.5 g/dL (ref 30.0–36.0)
MCV: 82.7 fL (ref 80.0–100.0)
Platelets: 244 10*3/uL (ref 150–400)
RBC: 4.17 MIL/uL (ref 3.87–5.11)
RDW: 14.6 % (ref 11.5–15.5)
WBC: 6.8 10*3/uL (ref 4.0–10.5)
nRBC: 0 % (ref 0.0–0.2)

## 2021-02-27 LAB — SARS CORONAVIRUS 2 (TAT 6-24 HRS): SARS Coronavirus 2: NEGATIVE

## 2021-02-27 LAB — TYPE AND SCREEN
ABO/RH(D): O POS
Antibody Screen: NEGATIVE

## 2021-02-27 LAB — RPR: RPR Ser Ql: NONREACTIVE

## 2021-03-01 ENCOUNTER — Encounter (HOSPITAL_COMMUNITY)
Admission: RE | Disposition: A | Discharge status: Home / Self Care | Payer: Self-pay | Source: Home / Self Care | Attending: Obstetrics and Gynecology

## 2021-03-01 ENCOUNTER — Inpatient Hospital Stay (HOSPITAL_COMMUNITY): Payer: BC Managed Care – PPO | Admitting: Anesthesiology

## 2021-03-01 ENCOUNTER — Other Ambulatory Visit: Payer: Self-pay

## 2021-03-01 ENCOUNTER — Encounter (HOSPITAL_COMMUNITY): Payer: Self-pay | Admitting: Obstetrics and Gynecology

## 2021-03-01 ENCOUNTER — Inpatient Hospital Stay (HOSPITAL_COMMUNITY)
Admission: RE | Admit: 2021-03-01 | Discharge: 2021-03-03 | DRG: 788 | Disposition: A | Discharge status: Home / Self Care | Payer: BC Managed Care – PPO | Attending: Obstetrics and Gynecology | Admitting: Obstetrics and Gynecology

## 2021-03-01 DIAGNOSIS — O24425 Gestational diabetes mellitus in childbirth, controlled by oral hypoglycemic drugs: Secondary | ICD-10-CM | POA: Diagnosis present

## 2021-03-01 DIAGNOSIS — Z3A39 39 weeks gestation of pregnancy: Secondary | ICD-10-CM | POA: Diagnosis not present

## 2021-03-01 DIAGNOSIS — Z8616 Personal history of COVID-19: Secondary | ICD-10-CM | POA: Diagnosis not present

## 2021-03-01 DIAGNOSIS — O321XX0 Maternal care for breech presentation, not applicable or unspecified: Principal | ICD-10-CM | POA: Diagnosis present

## 2021-03-01 DIAGNOSIS — Z98891 History of uterine scar from previous surgery: Secondary | ICD-10-CM

## 2021-03-01 DIAGNOSIS — Z20822 Contact with and (suspected) exposure to covid-19: Secondary | ICD-10-CM | POA: Diagnosis present

## 2021-03-01 LAB — GLUCOSE, CAPILLARY
Glucose-Capillary: 75 mg/dL (ref 70–99)
Glucose-Capillary: 72 mg/dL (ref 70–99)

## 2021-03-01 SURGERY — Surgical Case
Anesthesia: Spinal

## 2021-03-01 MED ORDER — LACTATED RINGERS IV SOLN: INTRAVENOUS | Status: DC

## 2021-03-01 MED ORDER — ZOLPIDEM TARTRATE 5 MG PO TABS: 5.0000 mg | ORAL_TABLET | Freq: Every evening | ORAL | Status: DC | PRN

## 2021-03-01 MED ORDER — PHENYLEPHRINE HCL-NACL 20-0.9 MG/250ML-% IV SOLN
INTRAVENOUS | Status: AC
  Filled 2021-03-01: qty 250

## 2021-03-01 MED ORDER — OXYTOCIN-SODIUM CHLORIDE 30-0.9 UT/500ML-% IV SOLN: 2.5000 [IU]/h | INTRAVENOUS | Status: AC

## 2021-03-01 MED ORDER — IBUPROFEN 600 MG PO TABS
600.0000 mg | ORAL_TABLET | Freq: Four times a day (QID) | ORAL | Status: DC | PRN
  Administered 2021-03-01 – 2021-03-03 (×6): 600 mg via ORAL
  Filled 2021-03-01 (×6): qty 1

## 2021-03-01 MED ORDER — MEPERIDINE HCL 25 MG/ML IJ SOLN
INTRAMUSCULAR | Status: DC | PRN
  Administered 2021-03-01: 12.5 mg via INTRAVENOUS

## 2021-03-01 MED ORDER — OXYTOCIN-SODIUM CHLORIDE 30-0.9 UT/500ML-% IV SOLN
INTRAVENOUS | Status: DC | PRN
  Administered 2021-03-01: 400 mL via INTRAVENOUS

## 2021-03-01 MED ORDER — PHENYLEPHRINE 40 MCG/ML (10ML) SYRINGE FOR IV PUSH (FOR BLOOD PRESSURE SUPPORT)
PREFILLED_SYRINGE | INTRAVENOUS | Status: AC
  Filled 2021-03-01: qty 10

## 2021-03-01 MED ORDER — OXYCODONE HCL 5 MG/5ML PO SOLN: 5.0000 mg | Freq: Once | ORAL | Status: DC | PRN

## 2021-03-01 MED ORDER — SODIUM CHLORIDE 0.9 % IR SOLN
Status: DC | PRN
  Administered 2021-03-01: 1

## 2021-03-01 MED ORDER — SCOPOLAMINE 1 MG/3DAYS TD PT72
MEDICATED_PATCH | TRANSDERMAL | Status: AC
  Filled 2021-03-01: qty 1

## 2021-03-01 MED ORDER — DIPHENHYDRAMINE HCL 25 MG PO CAPS
25.0000 mg | ORAL_CAPSULE | Freq: Four times a day (QID) | ORAL | Status: DC | PRN
  Filled 2021-03-01: qty 1

## 2021-03-01 MED ORDER — CEFAZOLIN SODIUM-DEXTROSE 2-4 GM/100ML-% IV SOLN
INTRAVENOUS | Status: AC
  Filled 2021-03-01: qty 100

## 2021-03-01 MED ORDER — DEXMEDETOMIDINE (PRECEDEX) IN NS 20 MCG/5ML (4 MCG/ML) IV SYRINGE
PREFILLED_SYRINGE | INTRAVENOUS | Status: AC
  Filled 2021-03-01: qty 5

## 2021-03-01 MED ORDER — SCOPOLAMINE 1 MG/3DAYS TD PT72
MEDICATED_PATCH | TRANSDERMAL | Status: DC | PRN
  Administered 2021-03-01: 1 via TRANSDERMAL

## 2021-03-01 MED ORDER — MEPERIDINE HCL 25 MG/ML IJ SOLN
INTRAMUSCULAR | Status: AC
  Filled 2021-03-01: qty 1

## 2021-03-01 MED ORDER — MEPERIDINE HCL 25 MG/ML IJ SOLN
6.2500 mg | INTRAMUSCULAR | Status: DC | PRN
Start: 2021-03-01 — End: 2021-03-01

## 2021-03-01 MED ORDER — CEFAZOLIN SODIUM-DEXTROSE 2-4 GM/100ML-% IV SOLN: 2.0000 g | INTRAVENOUS | Status: DC

## 2021-03-01 MED ORDER — SCOPOLAMINE 1 MG/3DAYS TD PT72: 1.0000 | MEDICATED_PATCH | Freq: Once | TRANSDERMAL | Status: DC

## 2021-03-01 MED ORDER — CEFAZOLIN SODIUM-DEXTROSE 2-3 GM-%(50ML) IV SOLR
INTRAVENOUS | Status: DC | PRN
  Administered 2021-03-01: 2 g via INTRAVENOUS

## 2021-03-01 MED ORDER — NALBUPHINE HCL 10 MG/ML IJ SOLN: 5.0000 mg | INTRAMUSCULAR | Status: DC | PRN

## 2021-03-01 MED ORDER — DEXMEDETOMIDINE (PRECEDEX) IN NS 20 MCG/5ML (4 MCG/ML) IV SYRINGE
PREFILLED_SYRINGE | INTRAVENOUS | Status: DC | PRN
  Administered 2021-03-01: 4 ug via INTRAVENOUS
  Administered 2021-03-01: 8 ug via INTRAVENOUS

## 2021-03-01 MED ORDER — NALBUPHINE HCL 10 MG/ML IJ SOLN: 5.0000 mg | Freq: Once | INTRAMUSCULAR | Status: DC | PRN

## 2021-03-01 MED ORDER — SODIUM CHLORIDE 0.9% FLUSH: 3.0000 mL | INTRAVENOUS | Status: DC | PRN

## 2021-03-01 MED ORDER — OXYCODONE-ACETAMINOPHEN 5-325 MG PO TABS
1.0000 | ORAL_TABLET | ORAL | Status: DC | PRN
  Administered 2021-03-02: 1 via ORAL
  Filled 2021-03-01 (×2): qty 1

## 2021-03-01 MED ORDER — OXYCODONE HCL 5 MG PO TABS: 5.0000 mg | ORAL_TABLET | Freq: Once | ORAL | Status: DC | PRN

## 2021-03-01 MED ORDER — FENTANYL CITRATE (PF) 100 MCG/2ML IJ SOLN
INTRAMUSCULAR | Status: DC | PRN
  Administered 2021-03-01: 15 ug via INTRATHECAL

## 2021-03-01 MED ORDER — DEXAMETHASONE SODIUM PHOSPHATE 4 MG/ML IJ SOLN
INTRAMUSCULAR | Status: AC
  Filled 2021-03-01: qty 2

## 2021-03-01 MED ORDER — ONDANSETRON HCL 4 MG/2ML IJ SOLN
INTRAMUSCULAR | Status: DC | PRN
  Administered 2021-03-01: 4 mg via INTRAVENOUS

## 2021-03-01 MED ORDER — ONDANSETRON HCL 4 MG/2ML IJ SOLN
INTRAMUSCULAR | Status: AC
  Filled 2021-03-01: qty 2

## 2021-03-01 MED ORDER — PRENATAL MULTIVITAMIN CH
1.0000 | ORAL_TABLET | Freq: Every day | ORAL | Status: DC
  Administered 2021-03-02: 1 via ORAL
  Filled 2021-03-01: qty 1

## 2021-03-01 MED ORDER — PHENYLEPHRINE HCL (PRESSORS) 10 MG/ML IV SOLN
INTRAVENOUS | Status: DC | PRN
  Administered 2021-03-01: 80 ug via INTRAVENOUS

## 2021-03-01 MED ORDER — TETANUS-DIPHTH-ACELL PERTUSSIS 5-2.5-18.5 LF-MCG/0.5 IM SUSY: 0.5000 mL | PREFILLED_SYRINGE | Freq: Once | INTRAMUSCULAR | Status: DC

## 2021-03-01 MED ORDER — BUPIVACAINE IN DEXTROSE 0.75-8.25 % IT SOLN
INTRATHECAL | Status: DC | PRN
  Administered 2021-03-01: 1.6 mL via INTRATHECAL

## 2021-03-01 MED ORDER — FENTANYL CITRATE (PF) 100 MCG/2ML IJ SOLN: 25.0000 ug | INTRAMUSCULAR | Status: DC | PRN

## 2021-03-01 MED ORDER — FENTANYL CITRATE (PF) 100 MCG/2ML IJ SOLN
INTRAMUSCULAR | Status: AC
  Filled 2021-03-01: qty 2

## 2021-03-01 MED ORDER — OXYTOCIN-SODIUM CHLORIDE 30-0.9 UT/500ML-% IV SOLN
INTRAVENOUS | Status: AC
  Filled 2021-03-01: qty 500

## 2021-03-01 MED ORDER — KETOROLAC TROMETHAMINE 30 MG/ML IJ SOLN
30.0000 mg | Freq: Once | INTRAMUSCULAR | Status: AC
  Administered 2021-03-01: 30 mg via INTRAVENOUS

## 2021-03-01 MED ORDER — DIPHENHYDRAMINE HCL 25 MG PO CAPS
25.0000 mg | ORAL_CAPSULE | ORAL | Status: DC | PRN
  Administered 2021-03-02 (×2): 25 mg via ORAL
  Filled 2021-03-01: qty 1

## 2021-03-01 MED ORDER — METOCLOPRAMIDE HCL 5 MG/ML IJ SOLN
INTRAMUSCULAR | Status: DC | PRN
  Administered 2021-03-01: 10 mg via INTRAVENOUS

## 2021-03-01 MED ORDER — DIBUCAINE (PERIANAL) 1 % EX OINT: 1.0000 "application " | TOPICAL_OINTMENT | CUTANEOUS | Status: DC | PRN

## 2021-03-01 MED ORDER — MORPHINE SULFATE (PF) 0.5 MG/ML IJ SOLN
INTRAMUSCULAR | Status: DC | PRN
  Administered 2021-03-01: 150 ug via INTRATHECAL

## 2021-03-01 MED ORDER — MORPHINE SULFATE (PF) 0.5 MG/ML IJ SOLN
INTRAMUSCULAR | Status: AC
  Filled 2021-03-01: qty 10

## 2021-03-01 MED ORDER — STERILE WATER FOR IRRIGATION IR SOLN
Status: DC | PRN
Start: 2021-03-01 — End: 2021-03-01
  Administered 2021-03-01: 1000 mL

## 2021-03-01 MED ORDER — SIMETHICONE 80 MG PO CHEW: 80.0000 mg | CHEWABLE_TABLET | ORAL | Status: DC | PRN

## 2021-03-01 MED ORDER — ONDANSETRON HCL 4 MG/2ML IJ SOLN
4.0000 mg | Freq: Three times a day (TID) | INTRAMUSCULAR | Status: DC | PRN
Start: 2021-03-01 — End: 2021-03-03

## 2021-03-01 MED ORDER — DEXAMETHASONE SODIUM PHOSPHATE 4 MG/ML IJ SOLN
INTRAMUSCULAR | Status: DC | PRN
  Administered 2021-03-01: 8 mg via INTRAVENOUS

## 2021-03-01 MED ORDER — PROMETHAZINE HCL 25 MG/ML IJ SOLN: 6.2500 mg | INTRAMUSCULAR | Status: DC | PRN

## 2021-03-01 MED ORDER — KETOROLAC TROMETHAMINE 30 MG/ML IJ SOLN
INTRAMUSCULAR | Status: AC
  Filled 2021-03-01: qty 1

## 2021-03-01 MED ORDER — LIDOCAINE HCL (PF) 2 % IJ SOLN
INTRAMUSCULAR | Status: AC
  Filled 2021-03-01: qty 5

## 2021-03-01 MED ORDER — LACTATED RINGERS IV SOLN: INTRAVENOUS | Status: DC | PRN

## 2021-03-01 MED ORDER — ACETAMINOPHEN 10 MG/ML IV SOLN
INTRAVENOUS | Status: AC
  Filled 2021-03-01: qty 100

## 2021-03-01 MED ORDER — MENTHOL 3 MG MT LOZG: 1.0000 | LOZENGE | OROMUCOSAL | Status: DC | PRN

## 2021-03-01 MED ORDER — POVIDONE-IODINE 10 % EX SWAB
2.0000 "application " | Freq: Once | CUTANEOUS | Status: AC
  Administered 2021-03-01: 2 via TOPICAL

## 2021-03-01 MED ORDER — COCONUT OIL OIL
1.0000 "application " | TOPICAL_OIL | Status: DC | PRN
  Administered 2021-03-02: 1 via TOPICAL

## 2021-03-01 MED ORDER — SENNOSIDES-DOCUSATE SODIUM 8.6-50 MG PO TABS
2.0000 | ORAL_TABLET | ORAL | Status: DC
  Administered 2021-03-02: 2 via ORAL
  Filled 2021-03-01: qty 2

## 2021-03-01 MED ORDER — NALOXONE HCL 4 MG/10ML IJ SOLN
1.0000 ug/kg/h | INTRAVENOUS | Status: DC | PRN
  Filled 2021-03-01: qty 5

## 2021-03-01 MED ORDER — NALOXONE HCL 0.4 MG/ML IJ SOLN: 0.4000 mg | INTRAMUSCULAR | Status: DC | PRN

## 2021-03-01 MED ORDER — PHENYLEPHRINE HCL-NACL 20-0.9 MG/250ML-% IV SOLN
INTRAVENOUS | Status: DC | PRN
  Administered 2021-03-01: 60 ug/min via INTRAVENOUS

## 2021-03-01 MED ORDER — METOCLOPRAMIDE HCL 5 MG/ML IJ SOLN
INTRAMUSCULAR | Status: AC
  Filled 2021-03-01: qty 2

## 2021-03-01 MED ORDER — DIPHENHYDRAMINE HCL 50 MG/ML IJ SOLN: 12.5000 mg | Freq: Four times a day (QID) | INTRAMUSCULAR | Status: DC | PRN

## 2021-03-01 MED ORDER — ACETAMINOPHEN 10 MG/ML IV SOLN
1000.0000 mg | Freq: Once | INTRAVENOUS | Status: DC | PRN
  Administered 2021-03-01: 1000 mg via INTRAVENOUS

## 2021-03-01 MED ORDER — WITCH HAZEL-GLYCERIN EX PADS: 1.0000 "application " | MEDICATED_PAD | CUTANEOUS | Status: DC | PRN

## 2021-03-01 MED ORDER — SIMETHICONE 80 MG PO CHEW
80.0000 mg | CHEWABLE_TABLET | Freq: Three times a day (TID) | ORAL | Status: DC
  Administered 2021-03-01 – 2021-03-03 (×4): 80 mg via ORAL
  Filled 2021-03-01 (×4): qty 1

## 2021-03-01 SURGICAL SUPPLY — 40 items
BENZOIN TINCTURE PRP APPL 2/3 (GAUZE/BANDAGES/DRESSINGS) ×3 IMPLANT
CHLORAPREP W/TINT 26ML (MISCELLANEOUS) ×3 IMPLANT
CLAMP CORD UMBIL (MISCELLANEOUS) IMPLANT
CLOSURE STERI-STRIP 1/2X4 (GAUZE/BANDAGES/DRESSINGS) ×1
CLOSURE WOUND 1/2 X4 (GAUZE/BANDAGES/DRESSINGS) ×1
CLOTH BEACON ORANGE TIMEOUT ST (SAFETY) ×3 IMPLANT
CLSR STERI-STRIP ANTIMIC 1/2X4 (GAUZE/BANDAGES/DRESSINGS) ×2 IMPLANT
DRAIN JACKSON PRT FLT 10 (DRAIN) IMPLANT
DRSG OPSITE POSTOP 4X10 (GAUZE/BANDAGES/DRESSINGS) ×3 IMPLANT
ELECT REM PT RETURN 9FT ADLT (ELECTROSURGICAL) ×3
ELECTRODE REM PT RTRN 9FT ADLT (ELECTROSURGICAL) ×1 IMPLANT
EVACUATOR SILICONE 100CC (DRAIN) IMPLANT
EXTRACTOR VACUUM M CUP 4 TUBE (SUCTIONS) IMPLANT
EXTRACTOR VACUUM M CUP 4' TUBE (SUCTIONS)
GLOVE BIO SURGEON STRL SZ 6.5 (GLOVE) ×2 IMPLANT
GLOVE BIO SURGEONS STRL SZ 6.5 (GLOVE) ×1
GLOVE BIOGEL PI IND STRL 7.0 (GLOVE) ×2 IMPLANT
GLOVE BIOGEL PI INDICATOR 7.0 (GLOVE) ×4
GOWN STRL REUS W/TWL LRG LVL3 (GOWN DISPOSABLE) ×6 IMPLANT
KIT ABG SYR 3ML LUER SLIP (SYRINGE) IMPLANT
NEEDLE HYPO 25X5/8 SAFETYGLIDE (NEEDLE) IMPLANT
NS IRRIG 1000ML POUR BTL (IV SOLUTION) ×3 IMPLANT
PACK C SECTION WH (CUSTOM PROCEDURE TRAY) ×3 IMPLANT
PAD OB MATERNITY 4.3X12.25 (PERSONAL CARE ITEMS) ×3 IMPLANT
PENCIL SMOKE EVAC W/HOLSTER (ELECTROSURGICAL) ×3 IMPLANT
RTRCTR C-SECT PINK 25CM LRG (MISCELLANEOUS) IMPLANT
STRIP CLOSURE SKIN 1/2X4 (GAUZE/BANDAGES/DRESSINGS) ×2 IMPLANT
SUT CHROMIC 0 CT 1 (SUTURE) ×3 IMPLANT
SUT MNCRL AB 3-0 PS2 27 (SUTURE) ×3 IMPLANT
SUT PLAIN 2 0 (SUTURE) ×4
SUT PLAIN 2 0 XLH (SUTURE) ×3 IMPLANT
SUT PLAIN ABS 2-0 CT1 27XMFL (SUTURE) ×2 IMPLANT
SUT SILK 2 0 SH (SUTURE) IMPLANT
SUT VIC AB 0 CTX 36 (SUTURE) ×8
SUT VIC AB 0 CTX36XBRD ANBCTRL (SUTURE) ×4 IMPLANT
SUT VIC AB 2-0 SH 27 (SUTURE)
SUT VIC AB 2-0 SH 27XBRD (SUTURE) IMPLANT
TOWEL OR 17X24 6PK STRL BLUE (TOWEL DISPOSABLE) ×3 IMPLANT
TRAY FOLEY W/BAG SLVR 14FR LF (SET/KITS/TRAYS/PACK) ×3 IMPLANT
WATER STERILE IRR 1000ML POUR (IV SOLUTION) ×3 IMPLANT

## 2021-03-01 NOTE — Anesthesia Procedure Notes (Signed)
Spinal  Patient location during procedure: OR Start time: 03/01/2021 10:39 AM End time: 03/01/2021 10:49 AM Reason for block: surgical anesthesia Staffing Performed: anesthesiologist  Anesthesiologist: Mellody Dance, MD Preanesthetic Checklist Completed: patient identified, IV checked, risks and benefits discussed, surgical consent, monitors and equipment checked, pre-op evaluation and timeout performed Spinal Block Patient position: sitting Prep: DuraPrep Patient monitoring: cardiac monitor, continuous pulse ox and blood pressure Approach: midline Location: L2-3 Injection technique: single-shot Needle Needle type: Pencan  Needle gauge: 24 G Needle length: 9 cm Assessment Events: CSF return Additional Notes Functioning IV was confirmed and monitors were applied. Sterile prep and drape, including hand hygiene and sterile gloves were used. The patient was positioned and the spine was prepped. The skin was anesthetized with lidocaine.  Free flow of clear CSF was obtained prior to injecting local anesthetic into the CSF.  The spinal needle aspirated freely following injection.  The needle was carefully withdrawn.  The patient tolerated the procedure well.   Called for assistance by Dr. Bradley Ferris. Patient with pronounced scoliosis and obesity BMI 38. 1st level unsuccessful. I advanced to L2-L3 vs. L1-L2 (unsure given patient's anatomical landmarks). + CSF. + swirl with aspiration. Intrathecal meds given. Patient tolerated well. Tanna Furry, MD

## 2021-03-01 NOTE — Transfer of Care (Signed)
Immediate Anesthesia Transfer of Care Note  Patient: Amy Valenzuela  Procedure(s) Performed: CESAREAN SECTION  Patient Location: PACU  Anesthesia Type:Spinal  Level of Consciousness: awake, alert  and oriented  Airway & Oxygen Therapy: Patient Spontanous Breathing  Post-op Assessment: Report given to RN and Post -op Vital signs reviewed and stable  Post vital signs: Reviewed and stable  Last Vitals:  Vitals Value Taken Time  BP 132/85 03/01/21 1215  Temp    Pulse 75 03/01/21 1216  Resp 22 03/01/21 1216  SpO2 100 % 03/01/21 1216  Vitals shown include unvalidated device data.  Last Pain:  Vitals:   03/01/21 0755  TempSrc: Oral  PainSc: 0-No pain         Complications: No notable events documented.

## 2021-03-01 NOTE — Anesthesia Preprocedure Evaluation (Addendum)
Anesthesia Evaluation  Patient identified by MRN, date of birth, ID band Patient awake    Reviewed: Allergy & Precautions, NPO status , Patient's Chart, lab work & pertinent test results  Airway Mallampati: III  TM Distance: >3 FB Neck ROM: Full    Dental no notable dental hx.    Pulmonary neg pulmonary ROS,    Pulmonary exam normal breath sounds clear to auscultation       Cardiovascular negative cardio ROS Normal cardiovascular exam Rhythm:Regular Rate:Normal     Neuro/Psych negative neurological ROS  negative psych ROS   GI/Hepatic negative GI ROS, Neg liver ROS,   Endo/Other  diabetes, Gestational, Oral Hypoglycemic Agents  Renal/GU negative Renal ROS     Musculoskeletal negative musculoskeletal ROS (+)   Abdominal   Peds  Hematology  (+) anemia ,   Anesthesia Other Findings BREECH  Reproductive/Obstetrics (+) Pregnancy                            Anesthesia Physical Anesthesia Plan  ASA: 2  Anesthesia Plan: Spinal   Post-op Pain Management:    Induction: Intravenous  PONV Risk Score and Plan: 2 and Ondansetron, Dexamethasone and Treatment may vary due to age or medical condition  Airway Management Planned: Natural Airway  Additional Equipment:   Intra-op Plan:   Post-operative Plan:   Informed Consent: I have reviewed the patients History and Physical, chart, labs and discussed the procedure including the risks, benefits and alternatives for the proposed anesthesia with the patient or authorized representative who has indicated his/her understanding and acceptance.     Dental advisory given  Plan Discussed with: CRNA  Anesthesia Plan Comments:         Anesthesia Quick Evaluation

## 2021-03-01 NOTE — H&P (Signed)
OB ADMISSION/ HISTORY & PHYSICAL:  Admission Date: 03/01/2021  7:25 AM  Admit Diagnosis: Primary cesarean for breech  Amy Valenzuela is a 25 y.o. female G1P0 [redacted]w[redacted]d presenting for primary cesarean for breech. Endorses active FM, denies ctx, LOF and vaginal bleeding.  History of current pregnancy: G1P0   Primary OB Provider: [facility] Patient entered care with [facility] at 6.3 wks.   EDC 03/07/21 by 6.3 wk U/S.   Anatomy scan:  19.6 wks, complete w/ anterior placenta.   Antenatal testing: for GDMA2 started at 31.6 weeks Last evaluation: 38.6  wks  Significant prenatal events:   Patient Active Problem List   Diagnosis Date Noted   Breech birth 03/01/2021   Gestational diabetes mellitus (GDM), antepartum 01/18/2021   COVID-19 affecting pregnancy in first trimester 09/05/2020    Prenatal Labs: ABO, Rh: --/--/O POS (06/22 0959) Antibody: NEG (06/22 0959) Rubella: Immune (11/08 0000)  RPR: NON REACTIVE (06/22 0959)  HBsAg: Negative (11/08 0000)  HIV: Non-reactive (11/08 0000)  GTT: 160 GBS:   Negative GC/CHL: negative/negative Genetics: Low risk female Tdap/influenza vaccines: UTD   OB History  Gravida Para Term Preterm AB Living  1            SAB IAB Ectopic Multiple Live Births               # Outcome Date GA Lbr Len/2nd Weight Sex Delivery Anes PTL Lv  1 Current             Medical / Surgical History: Past medical history:  Past Medical History:  Diagnosis Date   Diabetes mellitus without complication (HCC)    Gestational diabetes     Past surgical history:  Past Surgical History:  Procedure Laterality Date   MOUTH SURGERY     WISDOM TOOTH EXTRACTION     Family History:  Family History  Problem Relation Age of Onset   Hypertension Father     Social History:  reports that she has never smoked. She has never used smokeless tobacco. She reports that she does not drink alcohol and does not use drugs.  Allergies: Patient has no known allergies.   Current Medications at  time of admission:  Prior to Admission medications   Medication Sig Start Date End Date Taking? Authorizing Provider  glipiZIDE (GLUCOTROL) 5 MG tablet Take 5 mg by mouth daily before breakfast.   Yes [provider]  Prenatal Vit-Fe Fumarate-FA (PRENATAL MULTIVITAMIN) TABS tablet Take 1 tablet by mouth daily at 12 noon.   Yes [provider]    Review of Systems: Constitutional: Negative   HENT: Negative   Eyes: Negative   Respiratory: Negative   Cardiovascular: Negative   Gastrointestinal: Negative  Genitourinary: negative for bloody show, negative for LOF   Musculoskeletal: Negative   Skin: Negative   Neurological: Negative   Endo/Heme/Allergies: Negative   Psychiatric/Behavioral: Negative    Physical Exam: VS: Blood pressure (!) 137/91, pulse 92, temperature 98.7 F (37.1 C), temperature source Oral, resp. rate 20, height 5\' 2"  (1.575 m), weight 93.9 kg, last menstrual period 05/31/2020. AAO x3, no signs of distress Cardiovascular: RRR Respiratory: Lung fields clear to ausculation GU/GI: Abdomen gravid, non-tender, non-distended, active FM, vertex, EFW 7-8lbs per Leopold's Extremities: negative edema, negative for pain, tenderness, and cords Doppler: FHT's 148 BS U/S : Breech    Prenatal Transfer Tool  Maternal Diabetes: Yes:  Diabetes Type:  Insulin/Medication controlled Genetic Screening: Normal Maternal Ultrasounds/Referrals: Normal Fetal Ultrasounds or other Referrals:  None Maternal Substance  Abuse:  No Significant Maternal Medications:  Meds include: Other:  glyburide Significant Maternal Lab Results: Group B Strep negative    Assessment: 25 y.o. G1P0 [redacted]w[redacted]d admitted for primary cesarean for breech presentation. GDMA2 on glyburide.  Bedside u/s: Breech   Plan:  Admit to L&D Routine cesarean admission orders  Dr Normand Sloop aware of pt status and POC.  Gerhard Munch Muntaha Vermette MSN, CNM 03/01/2021 8:11 AM

## 2021-03-01 NOTE — Lactation Note (Signed)
This note was copied from a baby's chart. Lactation Consultation Note  Patient Name: Amy Valenzuela HMCNO'[id number] Date: 03/01/2021 Reason for consult: Initial assessment Age:27 hours  Initial visit to 7 hours old infant of a P1 mother. Mother states baby has been at breast since birth. Mother denies pain or discomfort. LC assisted hand expression, unable to see colostrum. Mother requests DEBP set up. Reviewed pumping basics.   Feeding plan:  1-Skin to skin 2-Aim for a deep, comfortable latch 3-Breastfeeding on demand or 8-12 times in 24h period. 4-Keep infant awake during breastfeeding session: massaging breast, infant's hand/shoulder/feet 5-Pump or hand-express and offer EBM. 6-Monitor voids and stools as signs good intake.  7-Encouraged maternal rest, hydration and food intake.  8-Contact LC as needed for feeds/support/concerns/questions    All questions answered at this time. Provided Lactation services brochure and promoted [employer] booklet information. tea   Maternal Data Has patient been taught Hand Expression?: Yes Does the patient have breastfeeding experience prior to this delivery?: No  Feeding Mother's Current Feeding Choice: Breast Milk  Lactation Tools Discussed/Used Tools: Pump;Flanges Flange Size: 24 Breast pump type: Double-Electric Breast Pump;Manual Pump Education: Setup, frequency, and cleaning;Milk Storage Reason for Pumping: mother's request Pumping frequency: as needed  Interventions Interventions: Breast feeding basics reviewed;Education;Skin to skin;Breast massage;Expressed milk;DEBP;Hand pump;Hand express  Discharge Pump: Personal;DEBP;Manual WIC Program: Yes  Consult Status Consult Status: Follow-up Date: 03/02/21 Follow-up type: In-patient    Amy Valenzuela A Amy Valenzuela Amy Valenzuela 03/01/2021, 6:42 PM

## 2021-03-01 NOTE — Op Note (Signed)
Cesarean Section Procedure Note   [facility]  03/01/2021  Indications: Breech Presentation   Pre-operative Diagnosis: BREECH.   Post-operative Diagnosis: Same   Surgeon: Surgeon(s) and Role:    * Jaymes Graff, MD - Primary   Assistants: Aldine Contes CNM .  She was needed to perform the procedure.    Anesthesia: spinal   Procedure Details:  The patient was seen in the Holding Room. The risks, benefits, complications, treatment options, and expected outcomes were discussed with the patient. The patient concurred with the proposed plan, giving informed consent. identified as Amy Valenzuela and the procedure verified as C-Section Delivery. A Time Out was held and the above information confirmed.  After induction of anesthesia, the patient was draped and prepped in the usual sterile manner. A transverse incision was made and carried down through the subcutaneous tissue to the fascia. Fascial incision was made in the midline and extended transversely. The fascia was separated from the underlying rectus muscle superiorly and inferiorly. The peritoneum was identified and entered. Peritoneal incision was extended longitudinally with good visualization of bowel and bladder. The utero-vesical peritoneal reflection was incised transversely and the bladder flap was bluntly freed from the lower uterine segment.  An alexsis retractor was placed in the abdomen.   A low transverse uterine incision was made. Delivered from breech complete presentation was a  infant, with Apgar scores of 8 at one minute and 10 at five minutes. Cord ph was not sent the umbilical cord was clamped and cut cord blood was obtained for evaluation. The placenta was removed Intact and appeared normal. The uterine outline, tubes and ovaries appeared normal}. The uterine incision was closed with running locked sutures of 0Vicryl. A second layer 0 vicrlyl was used to imbricate the uterine incision    Hemostasis was observed. Lavage was  carried out until clear. The alexsis was removed.  The peritoneum was closed with 0 chromic.  The muscles were examined and any bleeders were made hemostatic using bovie cautery device.   The fascia was then reapproximated with running sutures of 0 vicryl.  The subcutaneous tissue was reapproximated  With interrupted stitches using 2-0 plain gut. The subcuticular closure was performed using 3-55monocryl     Instrument, sponge, and needle counts were correct prior the abdominal closure and were correct at the conclusion of the case.    Findings: infant was delivered from complete breech  presentation. The fluid was clear.  The uterus tubes and ovaries appeared normal.     Estimated Blood Loss: 224cc   Total IV Fluids:   Urine Output:  200CC OF clear urine  Specimens: placenta to pathology  Complications: no complications  Disposition: PACU - hemodynamically stable.   Maternal Condition: stable   Baby condition / location:  Couplet care / Skin to Skin  Attending Attestation: I performed the procedure.   Signed: Surgeon(s): Jaymes Graff, MD

## 2021-03-01 NOTE — Anesthesia Postprocedure Evaluation (Signed)
Anesthesia Post Note  Patient: Amy Valenzuela  Procedure(s) Performed: CESAREAN SECTION     Patient location during evaluation: PACU Anesthesia Type: Spinal Level of consciousness: awake and alert Pain management: pain level controlled Vital Signs Assessment: post-procedure vital signs reviewed and stable Respiratory status: spontaneous breathing, respiratory function stable and patient connected to nasal cannula oxygen Cardiovascular status: blood pressure returned to baseline and stable Postop Assessment: no headache, no backache and no apparent nausea or vomiting Anesthetic complications: no Comments: Difficultly with performing spinal anesthesia discussed with patient and family   No notable events documented.  Last Vitals:  Vitals:   03/01/21 1454 03/01/21 1601  BP: 117/75 125/78  Pulse: 84 83  Resp: 18 17  Temp: 36.9 C 37 C  SpO2:  97%    Last Pain:  Vitals:   03/01/21 1601  TempSrc: Oral  PainSc:    Pain Goal:                   Catheryn Bacon Voncille Simm

## 2021-03-02 LAB — GLUCOSE, RANDOM: Glucose, Bld: 101 mg/dL — ABNORMAL HIGH (ref 70–99)

## 2021-03-02 LAB — CBC
HCT: 30 % — ABNORMAL LOW (ref 36.0–46.0)
Hemoglobin: 10.4 g/dL — ABNORMAL LOW (ref 12.0–15.0)
MCH: 28.7 pg (ref 26.0–34.0)
MCHC: 34.7 g/dL (ref 30.0–36.0)
MCV: 82.9 fL (ref 80.0–100.0)
Platelets: 223 10*3/uL (ref 150–400)
RBC: 3.62 MIL/uL — ABNORMAL LOW (ref 3.87–5.11)
RDW: 14.4 % (ref 11.5–15.5)
WBC: 17.3 10*3/uL — ABNORMAL HIGH (ref 4.0–10.5)
nRBC: 0 % (ref 0.0–0.2)

## 2021-03-02 NOTE — Progress Notes (Signed)
Subjective: POD# 1 Information for the patient's newborn:  Amy Valenzuela, Amy Valenzuela [[id number]]  female   Baby's Name Armoni Circumcision N.A  Reports feeling good Feeding: breast Reports tolerating PO and denies N/V, foley removed, ambulating and urinating w/o difficulty  Pain controlled with  PO meds Denies HA/SOB/dizziness  Flatus passing Vaginal bleeding is normal, no clots     Objective:  VS:  Vitals:   03/01/21 1715 03/01/21 2130 03/02/21 0200 03/02/21 0630  BP:  121/89 130/88 117/60  Pulse:  65 68 78  Resp:  18 18 18   Temp: 98.4 F (36.9 C) 99 F (37.2 C) 98.2 F (36.8 C) 98.5 F (36.9 C)  TempSrc: Oral Oral Oral Oral  SpO2:  100% 100% 100%  Weight:      Height:        Intake/Output Summary (Last 24 hours) at 03/02/2021 1144 Last data filed at 03/02/2021 0516 Gross per 24 hour  Intake 2612.41 ml  Output 3725 ml  Net -1112.59 ml     Recent Labs    03/02/21 0520  WBC 17.3*  HGB 10.4*  HCT 30.0*  PLT 223    Blood type: --/--/O POS (06/22 02-28-1974) Rubella: Immune (11/08 0000)    Physical Exam:  General: alert, cooperative, and no distress CV: Regular rate and rhythm or without murmur or extra heart sounds Resp: clear Abdomen: soft, nontender, normal bowel sounds Incision: clean, dry, intact, and serous drainage present Perineum:  Uterine Fundus: firm, below umbilicus, nontender Lochia:  appropriate Ext:  trace edema, neg for pain, tenderness, and cords   Assessment/Plan: 25 y.o.   POD# 1. G1P1001                  Active Problems:   Breech birth   S/P cesarean section   Postpartum care following cesarean delivery   Routine post-op PP care          Advance diet as tolerated Advised warm fluids and ambulation to improve GI motility Encourage rest when baby rests Breastfeeding support Anticipate D/C 03/03/21  03/05/21, MSN, CNM 03/02/2021, 11:44 AM

## 2021-03-02 NOTE — Lactation Note (Signed)
This note was copied from a baby's chart. Lactation Consultation Note  Patient Name: Girl Vineta Carone GEZMO'Q Date: 03/02/2021 Reason for consult: Follow-up assessment;Mother's request;Difficult latch;Primapara;1st time breastfeeding;Term;Nipple pain/trauma Age:27 hours  Mom complaining of pain with the latch, compression stripes but no other signs of trauma. Mom to use 24 NS with latching due to soreness of her nipples prior to [facility] arrival.  Mom provided with comfort gels for nipple care. With instructions to rinse in between use and discard after 6 days.   Mom using manual pump with 24 flange with ease.  Mom will be switching to use DEBP set up prior to [facility] arrival. Pumping after latching q 3 hrs for 15 minutes.   Plan 1. To feed based on cues 8-12x. In 24 hr period no more than 4 hrs without an attempt. Mom to use 24 NS for now due to sore nipples.           2 Mom pump with DEBP q 3 hrs for 15 min  All questions reviewed at the end of the visit.   Maternal Data    Feeding Mother's Current Feeding Choice: Breast Milk  LATCH Score Latch: Repeated attempts needed to sustain latch, nipple held in mouth throughout feeding, stimulation needed to elicit sucking reflex.  Audible Swallowing: A few with stimulation  Type of Nipple: Everted at rest and after stimulation  Comfort (Breast/Nipple): Soft / non-tender  Hold (Positioning): Assistance needed to correctly position infant at breast and maintain latch.  LATCH Score: 7   Lactation Tools Discussed/Used Tools: Pump;Flanges;Nipple Shields Nipple shield size: 24 Flange Size: 24 Breast pump type: Double-Electric Breast Pump (Mom sized and set up prior to [facility] arrival. Mom states using 24 flange manual. LC not able to assess flange size with DEBP since we were breastfeeding at the time of the visit.) Pump Education: Setup, frequency, and cleaning;Milk Storage (set up completed prior to [facility] arrival. LC did reinforce Mom cleaning pump  parts after use along with 24 NS) Reason for Pumping: barrier to milk let down using 24 NS Pumping frequency: every 3 hrs for 15 min  Interventions Interventions: Breast feeding basics reviewed;Breast compression;Assisted with latch;Adjust position;Skin to skin;Support pillows;Breast massage;Position options;Hand express;Expressed milk;Education;Comfort gels;DEBP  Discharge    Consult Status Consult Status: Follow-up Date: 03/03/21 Follow-up type: In-patient    Skyler Carel  Nicholson-Springer 03/02/2021, 12:36 PM

## 2021-03-02 NOTE — Lactation Note (Signed)
This note was copied from a baby's chart. Lactation Consultation Note  Patient Name: Amy Valenzuela [id number] Date: 03/02/2021 Reason for consult: Follow-up assessment;Mother's request;Difficult latch;1st time breastfeeding;Term Age:27 hours P1, term female infant. Mom requested assistance with latching infant on her left breast, infant not sustaining latch and she only been latching infant on her right breast. LC assisted mom with latching infant on her left breast, mom used the football hold position, infant sustained latch and was still breastfeeding infant after 9 minutes when LC left the room. LC discussed with mom, if she feels pinching to break latch and re-latch infant at the breast. Mom will continue to BF infant according to cues, 8 to 12+ times within 24 hours, STS. Mom knows to call RN or LC if she needs further assistance with latching infant at the breast.  Maternal Data Has patient been taught Hand Expression?: Yes  Feeding Mother's Current Feeding Choice: Breast Milk  LATCH Score Latch: Grasps breast easily, tongue down, lips flanged, rhythmical sucking.  Audible Swallowing: Spontaneous and intermittent  Type of Nipple: Everted at rest and after stimulation  Comfort (Breast/Nipple): Soft / non-tender  Hold (Positioning): Assistance needed to correctly position infant at breast and maintain latch.  LATCH Score: 9   Lactation Tools Discussed/Used    Interventions Interventions: Breast massage;Hand express;Assisted with latch;Skin to skin;Breast compression;Adjust position;Support pillows;Position options;Education  Discharge    Consult Status Consult Status: Follow-up Date: 03/02/21 Follow-up type: In-patient    Danelle Earthly 03/02/2021, 3:08 AM

## 2021-03-02 NOTE — Lactation Note (Signed)
This note was copied from a baby's chart. Lactation Consultation Note  Patient Name: Amy Valenzuela RCVEL'F Date: 03/02/2021 Reason for consult: Follow-up assessment;Term;Primapara;1st time breastfeeding Age:27 hours   P1 mother whose infant is now 53 hours old.  This is a term baby at 39+1 weeks.  RN requested latch assistance.  Baby fussy when I arrived; mother attempting to console.  Offered to assist with latching and mother agreeable.  Assisted to latch to the left breast in the football hold.  Baby latched; initial sensitivity felt by mother which eased.  Baby very sleepy and required constant stimulation to continue sucking.  Observed sucking on/off for 5 minutes and suggested mother remove baby and awaken further.  Placed on the left breast again in the cross cradle position and baby seemed more eager to suck in this position.  Observed feeding on/off for 10 minutes with constant gentle stimulation.  Mother denied pain.  Reviewed breast feeding basics and suggested mother call for further assistance as needed.  Father asleep on the couch.   Maternal Data Has patient been taught Hand Expression?: Yes Does the patient have breastfeeding experience prior to this delivery?: No  Feeding Mother's Current Feeding Choice: Breast Milk  LATCH Score Latch: Repeated attempts needed to sustain latch, nipple held in mouth throughout feeding, stimulation needed to elicit sucking reflex.  Audible Swallowing: None  Type of Nipple: Everted at rest and after stimulation  Comfort (Breast/Nipple): Soft / non-tender  Hold (Positioning): Assistance needed to correctly position infant at breast and maintain latch.  LATCH Score: 6   Lactation Tools Discussed/Used    Interventions Interventions: Breast feeding basics reviewed;Assisted with latch;Skin to skin;Breast massage;Hand express;Breast compression;Adjust position;Position options;Support pillows;Education  Discharge    Consult  Status Consult Status: Follow-up Date: 03/03/21 Follow-up type: In-patient    Amy Valenzuela R Amy Valenzuela 03/02/2021, 5:40 AM

## 2021-03-03 LAB — GLUCOSE, RANDOM: Glucose, Bld: 73 mg/dL (ref 70–99)

## 2021-03-03 MED ORDER — OXYCODONE-ACETAMINOPHEN 5-325 MG PO TABS: 1.0000 | ORAL_TABLET | ORAL | 0 refills | Status: DC | PRN

## 2021-03-03 MED ORDER — ACETAMINOPHEN 500 MG PO TABS
1000.0000 mg | ORAL_TABLET | Freq: Three times a day (TID) | ORAL | Status: DC | PRN
  Administered 2021-03-03 (×2): 1000 mg via ORAL
  Filled 2021-03-03 (×2): qty 2

## 2021-03-03 MED ORDER — IBUPROFEN 600 MG PO TABS: 600.0000 mg | ORAL_TABLET | Freq: Four times a day (QID) | ORAL | 0 refills | Status: DC | PRN

## 2021-03-03 NOTE — Discharge Summary (Signed)
PCS OB Discharge Summary     Patient Name: Amy Valenzuela DOB: 09-03-1996 MRN: [id number]  Date of admission: 03/01/2021 Delivering MD: Jaymes Graff  Date of delivery: 03/01/2021 Type of delivery: PCS  Newborn Data: Sex: Baby female Live born female  Birth Weight: 7 lb 4.4 oz (3300 g) APGAR: 8, 10  Newborn Delivery   Birth date/time: 03/01/2021 11:17:00 Delivery type: C-Section, Low Transverse Trial of labor: No C-section categorization: Primary      Feeding: breast and bottle Infant being discharge to home with mother in stable condition.   Admitting diagnosis: Breech birth [O9.1XX0] S/P cesarean section [Z98.891] Intrauterine pregnancy: [redacted]w[redacted]d     Secondary diagnosis:  Active Problems:   Breech birth   S/P cesarean section   Postpartum care following cesarean delivery                                Complications: None                                                              Intrapartum Procedures: breech extraction and cesarean: low cervical, transverse Postpartum Procedures: none Complications-Operative and Postpartum: none Augmentation: AROM and at delivery   History of Present Illness: Ms. Amy Valenzuela is a 25 y.o. female, G1P1001, who presents at [redacted]w[redacted]d weeks gestation. The patient has been followed at  [facility]  Her pregnancy has been complicated by:  Patient Active Problem List   Diagnosis Date Noted   Postpartum care following cesarean delivery 03/02/2021   Breech birth 03/01/2021   S/P cesarean section 03/01/2021   Gestational diabetes mellitus (GDM), antepartum 01/18/2021   COVID-19 affecting pregnancy in first trimester 09/05/2020     Active Ambulatory Problems    Diagnosis Date Noted   COVID-19 affecting pregnancy in first trimester 09/05/2020   Gestational diabetes mellitus (GDM), antepartum 01/18/2021   Resolved Ambulatory Problems    Diagnosis Date Noted   No Resolved Ambulatory Problems   Past  Medical History:  Diagnosis Date   Diabetes mellitus without complication (HCC)    Gestational diabetes      Hospital course:  Sceduled C/S   25 y.o. yo G1P1001 at [redacted]w[redacted]d was admitted to the hospital 03/01/2021 for scheduled cesarean section with the following indication:Malpresentation.Delivery details are as follows:  Membrane Rupture Time/Date: 11:16 AM ,03/01/2021   Delivery Method:C-Section, Low Transverse  Details of operation can be found in separate operative note.  Patient had an uncomplicated postpartum course.  She is ambulating, tolerating a regular diet, passing flatus, and urinating well. Patient is discharged home in stable condition on  03/03/21        Newborn Data: Birth date:03/01/2021  Birth time:11:17 AM  Gender:Female  Living status:Living  Apgars:8 ,10  Weight:3300 g    Postpartum Postoperative Day # 2 :   Hospital Course--Scheduled Cesarean: Patient was admitted on 6/24 for a scheduled primary cesarean delivery for breech.   She was taken to the operating room, where Dr. Normand Sloop performed a primary LTCS under spinal anesthesia, with delivery of a viable baby female, with weight and Apgars as listed below. Infant was in good condition and remained at the patient's bedside.  The patient was taken to recovery in  good condition.  Patient planned to breast and bottle feed.  On post-op day 1, patient was doing well, tolerating a regular diet, with Hgb of 10.2-8.9, asymptomatic, po iron.  Throughout her stay, her physical exam was WNL, her incision was CDI, and her vital signs remained stable.  By post-op day 1, she was up ad lib, tolerating a regular diet, with good pain control with po med.  She was deemed to have received the full benefit of her hospital stay, and was discharged home in stable condition.  Contraceptive choice was undecided. Pt had GDM, FBS WNL, on glyburide during pregnancy, but no meds PP.      Physical exam  Vitals:   03/02/21 0200 03/02/21 0630 03/02/21  2048 03/03/21 0505  BP: 130/88 117/60 120/66 128/88  Pulse: 68 78 70 72  Resp: 18 18 17 18   Temp: 98.2 F (36.8 C) 98.5 F (36.9 C) 98.6 F (37 C) 98.2 F (36.8 C)  TempSrc: Oral Oral Oral   SpO2: 100% 100% 100% 100%  Weight:      Height:       General: alert, cooperative, and no distress Lochia: appropriate Uterine Fundus: firm Incision: Healing well with no significant drainage, No significant erythema, Dressing is clean, dry, and intact, honeycomb dressing CDI Perineum: intaCT DVT Evaluation: No evidence of DVT seen on physical exam. Negative Homan's sign. No cords or calf tenderness. No significant calf/ankle edema.  Labs: Lab Results  Component Value Date   WBC 17.3 (H) 03/02/2021   HGB 10.4 (L) 03/02/2021   HCT 30.0 (L) 03/02/2021   MCV 82.9 03/02/2021   PLT 223 03/02/2021   CMP Latest Ref Rng & Units 03/03/2021  Glucose 70 - 99 mg/dL 73  BUN 6 - 20 mg/dL -  Creatinine 03/05/2021 - 4.66 mg/dL -  Sodium 5.99 - 357 mmol/L -  Potassium 3.5 - 5.1 mmol/L -  Chloride 98 - 111 mmol/L -  CO2 22 - 32 mmol/L -  Calcium 8.9 - 10.3 mg/dL -  Total Protein 6.5 - 8.1 g/dL -  Total Bilirubin 0.3 - 1.2 mg/dL -  Alkaline Phos 38 - 017 U/L -  AST 15 - 41 U/L -  ALT 0 - 44 U/L -    Date of discharge: 03/03/2021 Discharge Diagnoses: Term Pregnancy-delivered Discharge instruction: per After Visit Summary and "Baby and Me Booklet".  After visit meds:   Activity:           unrestricted and pelvic rest Advance as tolerated. Pelvic rest for 6 weeks.  Diet:                routine Medications: PNV, Ibuprofen, Colace, Iron, and OXY IR  Postpartum contraception: Undecided Condition:  Pt discharge to home with baby in stable and CONDITION anEMIA: po iRON Gdma2: f/u 6 WEEKS pp FOR 2H gtt  Meds: Allergies as of 03/03/2021   No Known Allergies      Medication List     STOP taking these medications    glipiZIDE 5 MG tablet Commonly known as: GLUCOTROL       TAKE these  medications    ibuprofen 600 MG tablet Commonly known as: ADVIL Take 1 tablet (600 mg total) by mouth every 6 (six) hours as needed for cramping.   oxyCODONE-acetaminophen 5-325 MG tablet Commonly known as: PERCOCET/ROXICET Take 1 tablet by mouth every 4 (four) hours as needed for moderate pain.   prenatal multivitamin Tabs tablet Take 1 tablet by mouth daily at 12  noon.               Discharge Care Instructions  (From admission, onward)           Start     Ordered   03/03/21 0000  Discharge wound care:       Comments: Take dressing off on day 5-7 postpartum.  Report increased drainage, redness or warmth. Clean with water, let soap trickle down body. Can leave steri strips on until they fall off or take them off gently at day 10. Keep open to air, clean and dry.   03/03/21 1212            Discharge Follow Up:   Follow-up Information     [facility]. Schedule an appointment as soon as possible for a visit in 6 week(s).   Specialty: Obstetrics and Gynecology Contact information: [address] [place] [address] [phone]                 [place], NP-C, CNM 03/03/2021, 10:25 PM  Dale Clarendon, FNP

## 2021-03-03 NOTE — Lactation Note (Signed)
This note was copied from a baby's chart. Lactation Consultation Note  Patient Name: Amy Valenzuela [id number] Date: 03/03/2021 Reason for consult: Follow-up assessment Age:27 hours   P1 mother whose infant is now 45 hours old.  This is a term baby at 39+1 weeks.  Baby has been discharged.  Reviewed feeding plan for after discharge.  Mother had some basic breast feeding questions which I answered to her satisfaction.  Observed baby latched and feeding well; beginning to get sleepy.   Mother has our OP phone number for any questions after discharge.  Father present.     Maternal Data    Feeding    LATCH Score Latch: Grasps breast easily, tongue down, lips flanged, rhythmical sucking.  Audible Swallowing: A few with stimulation  Type of Nipple: Everted at rest and after stimulation  Comfort (Breast/Nipple): Filling, red/small blisters or bruises, mild/mod discomfort  Hold (Positioning): Assistance needed to correctly position infant at breast and maintain latch.  LATCH Score: 7   Lactation Tools Discussed/Used    Interventions    Discharge Discharge Education: Engorgement and breast care  Consult Status Consult Status: Complete Date: 03/03/21 Follow-up type: Call as needed    Amy Valenzuela 03/03/2021, 11:57 AM

## 2021-03-04 LAB — SURGICAL PATHOLOGY

## 2021-03-07 ENCOUNTER — Inpatient Hospital Stay (HOSPITAL_COMMUNITY)
Admission: AD | Admit: 2021-03-07 | Discharge: 2021-03-07 | Disposition: A | Discharge status: Home / Self Care | Payer: BC Managed Care – PPO | Attending: Obstetrics and Gynecology | Admitting: Obstetrics and Gynecology

## 2021-03-07 ENCOUNTER — Encounter (HOSPITAL_COMMUNITY): Payer: Self-pay | Admitting: Obstetrics and Gynecology

## 2021-03-07 ENCOUNTER — Other Ambulatory Visit: Payer: Self-pay

## 2021-03-07 DIAGNOSIS — O165 Unspecified maternal hypertension, complicating the puerperium: Secondary | ICD-10-CM | POA: Diagnosis not present

## 2021-03-07 DIAGNOSIS — R42 Dizziness and giddiness: Secondary | ICD-10-CM | POA: Insufficient documentation

## 2021-03-07 DIAGNOSIS — R600 Localized edema: Secondary | ICD-10-CM | POA: Insufficient documentation

## 2021-03-07 DIAGNOSIS — O135 Gestational [pregnancy-induced] hypertension without significant proteinuria, complicating the puerperium: Secondary | ICD-10-CM

## 2021-03-07 DIAGNOSIS — M7989 Other specified soft tissue disorders: Secondary | ICD-10-CM | POA: Diagnosis not present

## 2021-03-07 DIAGNOSIS — O139 Gestational [pregnancy-induced] hypertension without significant proteinuria, unspecified trimester: Secondary | ICD-10-CM | POA: Diagnosis present

## 2021-03-07 LAB — COMPREHENSIVE METABOLIC PANEL
ALT: 20 U/L (ref 0–44)
AST: 23 U/L (ref 15–41)
Albumin: 3 g/dL — ABNORMAL LOW (ref 3.5–5.0)
Alkaline Phosphatase: 75 U/L (ref 38–126)
Anion gap: 10 (ref 5–15)
BUN: 8 mg/dL (ref 6–20)
CO2: 22 mmol/L (ref 22–32)
Calcium: 8.7 mg/dL — ABNORMAL LOW (ref 8.9–10.3)
Chloride: 108 mmol/L (ref 98–111)
Creatinine, Ser: 0.78 mg/dL (ref 0.44–1.00)
GFR, Estimated: >60 mL/min (ref >60)
Glucose, Bld: 110 mg/dL — ABNORMAL HIGH (ref 70–99)
Potassium: 3.8 mmol/L (ref 3.5–5.1)
Sodium: 140 mmol/L (ref 135–145)
Total Bilirubin: 0.4 mg/dL (ref 0.3–1.2)
Total Protein: 6.1 g/dL — ABNORMAL LOW (ref 6.5–8.1)

## 2021-03-07 LAB — CBC
HCT: 32.6 % — ABNORMAL LOW (ref 36.0–46.0)
Hemoglobin: 11.2 g/dL — ABNORMAL LOW (ref 12.0–15.0)
MCH: 28.7 pg (ref 26.0–34.0)
MCHC: 34.4 g/dL (ref 30.0–36.0)
MCV: 83.6 fL (ref 80.0–100.0)
Platelets: 337 10*3/uL (ref 150–400)
RBC: 3.9 MIL/uL (ref 3.87–5.11)
RDW: 14.3 % (ref 11.5–15.5)
WBC: 7.2 10*3/uL (ref 4.0–10.5)
nRBC: 0 % (ref 0.0–0.2)

## 2021-03-07 MED ORDER — NIFEDIPINE ER OSMOTIC RELEASE 30 MG PO TB24: 30.0000 mg | ORAL_TABLET | Freq: Every day | ORAL | 2 refills | Status: DC

## 2021-03-07 MED ORDER — FUROSEMIDE 20 MG PO TABS: 20.0000 mg | ORAL_TABLET | Freq: Every day | ORAL | 0 refills | Status: DC

## 2021-03-07 NOTE — MAU Provider Note (Signed)
History     [id number]  Arrival date and time: 03/07/21 1624    Chief Complaint  Patient presents with   Hypertension   Leg Swelling     HPI Amy Valenzuela is a 25 y.o. s/p pLTCS on 03/01/21 for breech who presents for elevated BP.   Review of discharge summary from last admission: uncomplicated admission for scheduled CS for breech. On closer inspection of BP's during admission had some mild range BPs  Today reports she was at [facility] when she felt a little dizzy, took her blood pressure and it was 150s/90's She called her OB and was told to present for evaluation to MAU  She reports no headaches, vision changes, chest pain, shortness of breath, RUQ pain Does endorse lower extremity edema, slightly worse in L foot compared to R foot, not very significant in the rest of her legs Does not smoke No history personal or family of blood clots No recent travel   --/--/O POS (06/22 0959)  OB History     Gravida  1   Para  1   Term  1   Preterm      AB      Living  1      SAB      IAB      Ectopic      Multiple  0   Live Births  1           Past Medical History:  Diagnosis Date   Diabetes mellitus without complication (HCC)    Gestational diabetes     Past Surgical History:  Procedure Laterality Date   CESAREAN SECTION N/A 03/01/2021   Procedure: CESAREAN SECTION;  Surgeon: Jaymes Graff, MD;  Location: [facility] LD ORS;  Service: Obstetrics;  Laterality: N/A;   MOUTH SURGERY     WISDOM TOOTH EXTRACTION      Family History  Problem Relation Age of Onset   Hypertension Father     Social History   Socioeconomic History   Marital status: Single    Spouse name: Not on file   Number of children: Not on file   Years of education: Not on file   Highest education level: Not on file  Occupational History   Not on file  Tobacco Use   Smoking status: Never   Smokeless tobacco: Never  Substance and Sexual Activity   Alcohol use: No   Drug use: Never    Sexual activity: Yes  Other Topics Concern   Not on file  Social History Narrative   Not on file   Social Determinants of Health   Financial Resource Strain: Not on file  Food Insecurity: No Food Insecurity   Worried About Running Out of Food in the Last Year: Never true   Ran Out of Food in the Last Year: Never true  Transportation Needs: Not on file  Physical Activity: Not on file  Stress: Not on file  Social Connections: Not on file  Intimate Partner Violence: Not on file    No Known Allergies  No current facility-administered medications on file prior to encounter.   Current Outpatient Medications on File Prior to Encounter  Medication Sig Dispense Refill   ibuprofen (ADVIL) 600 MG tablet Take 1 tablet (600 mg total) by mouth every 6 (six) hours as needed for cramping. 30 tablet 0   oxyCODONE-acetaminophen (PERCOCET/ROXICET) 5-325 MG tablet Take 1 tablet by mouth every 4 (four) hours as needed for moderate pain. 20 tablet 0   Prenatal  Vit-Fe Fumarate-FA (PRENATAL MULTIVITAMIN) TABS tablet Take 1 tablet by mouth daily at 12 noon.       ROS Pertinent positives and negative per HPI, all others reviewed and negative  Physical Exam   BP 133/83   Pulse 65   Resp 17   SpO2 99%   Patient Vitals for the past 24 hrs:  BP Pulse Resp SpO2  03/07/21 1916 133/83 65 -- --  03/07/21 1900 (!) 144/90 78 -- --  03/07/21 1846 138/87 72 -- --  03/07/21 1830 133/87 83 -- --  03/07/21 1816 (!) 143/75 80 -- --  03/07/21 1801 137/87 86 -- --  03/07/21 1749 (!) 139/96 92 -- --  03/07/21 1730 132/87 81 -- --  03/07/21 1715 133/84 77 -- --  03/07/21 1700 136/80 74 17 99 %    Physical Exam Vitals reviewed.  Constitutional:      General: She is not in acute distress.    Appearance: She is well-developed. She is not diaphoretic.  Eyes:     General: No scleral icterus. Pulmonary:     Effort: Pulmonary effort is normal. No respiratory distress.  Abdominal:     General: There is  no distension.     Palpations: Abdomen is soft.     Tenderness: There is no abdominal tenderness. There is no guarding or rebound.  Skin:    General: Skin is warm and dry.  Neurological:     Mental Status: She is alert.     Coordination: Coordination normal.     Cervical Exam    Bedside Ultrasound Limited bedside US done to assess vasculature in L leg.  My interpretation: Large veins from common femoral down to popliteal are patient  FHT N/a  Labs Results for orders placed or performed during the hospital encounter of 03/07/21 (from the past 24 hour(s))  CBC     Status: Abnormal   Collection Time: 03/07/21  6:39 PM  Result Value Ref Range   WBC 7.2 4.0 - 10.5 K/uL   RBC 3.90 3.87 - 5.11 MIL/uL   Hemoglobin 11.2 (L) 12.0 - 15.0 g/dL   HCT 12.8 (L) 78.6 - 76.7 %   MCV 83.6 80.0 - 100.0 fL   MCH 28.7 26.0 - 34.0 pg   MCHC 34.4 30.0 - 36.0 g/dL   RDW 20.9 47.0 - 96.2 %   Platelets 337 150 - 400 K/uL   nRBC 0.0 0.0 - 0.2 %  Comprehensive metabolic panel     Status: Abnormal   Collection Time: 03/07/21  6:39 PM  Result Value Ref Range   Sodium 140 135 - 145 mmol/L   Potassium 3.8 3.5 - 5.1 mmol/L   Chloride 108 98 - 111 mmol/L   CO2 22 22 - 32 mmol/L   Glucose, Bld 110 (H) 70 - 99 mg/dL   BUN 8 6 - 20 mg/dL   Creatinine, Ser 8.36 0.44 - 1.00 mg/dL   Calcium 8.7 (L) 8.9 - 10.3 mg/dL   Total Protein 6.1 (L) 6.5 - 8.1 g/dL   Albumin 3.0 (L) 3.5 - 5.0 g/dL   AST 23 15 - 41 U/L   ALT 20 0 - 44 U/L   Alkaline Phosphatase 75 38 - 126 U/L   Total Bilirubin 0.4 0.3 - 1.2 mg/dL   GFR, Estimated >62 >94 mL/min   Anion gap 10 5 - 15    Imaging No results found.  MAU Course  Procedures Lab Orders  CBC  Comprehensive metabolic panel  Meds  ordered this encounter  Medications   NIFEdipine (PROCARDIA XL) 30 MG 24 hr tablet    Sig: Take 1 tablet (30 mg total) by mouth daily.    Dispense:  30 tablet    Refill:  2   furosemide (LASIX) 20 MG tablet    Sig: Take 1 tablet  (20 mg total) by mouth daily for 3 days.    Dispense:  3 tablet    Refill:  0    Imaging Orders  No imaging studies ordered today    MDM moderate  Assessment and Plan  #gHTN Based on prior pressures and mild range pressures today with normal labs she meets criteria for gHTN. Discussed with Dr. Normand Sloop who will arrange follow up in the office. Will start on Procardia 30 XL as well.  #Lower extremity edema Likely normal postpartum changes. Mildly increased swelling in L foot but bedside US without any evidence of DVT above the knee. Calves measured and only a 1.5cm difference. Beyond CS no other risk factors for DVT, but counseled on warning signs. Will give short course of lasix.   Discharged to home in stable condition.   Venora Maples, MD/MPH 03/07/21 7:45 PM  Allergies as of 03/07/2021   No Known Allergies      Medication List     TAKE these medications    furosemide 20 MG tablet Commonly known as: LASIX Take 1 tablet (20 mg total) by mouth daily for 3 days.   ibuprofen 600 MG tablet Commonly known as: ADVIL Take 1 tablet (600 mg total) by mouth every 6 (six) hours as needed for cramping.   NIFEdipine 30 MG 24 hr tablet Commonly known as: Procardia XL Take 1 tablet (30 mg total) by mouth daily.   oxyCODONE-acetaminophen 5-325 MG tablet Commonly known as: PERCOCET/ROXICET Take 1 tablet by mouth every 4 (four) hours as needed for moderate pain.   prenatal multivitamin Tabs tablet Take 1 tablet by mouth daily at 12 noon.

## 2021-03-07 NOTE — MAU Note (Signed)
...  Amy Valenzuela is a 25 y.o. at 6 days post partum in MAU reporting: patient states she was at [facility] earlier and "got hot" so she went to take her blood pressure and it was 151/90. She is endorsing bilateral swelling of her legs and feet. She states she is experiencing pain on the top of her left foot upon applying pressure. Denies RUQ pain, visual disturbances, and HA.   Lab orders placed from triage: UA

## 2021-03-13 ENCOUNTER — Telehealth (HOSPITAL_COMMUNITY): Payer: Self-pay

## 2021-03-13 NOTE — Telephone Encounter (Signed)
"  I went to my OB today and they took the dressing off. They said everything looked good. I'm feeling good." Mother declines any questions or concerns about her healing.  "She has an appointment with the pediatrician tomorrow. Her 2 day appointment was good. I'm pumping and bottle feeding. It has been difficult to get her to latch." RN reviewed breastfeeding resources, will email information to her. Mother declines any questions or concerns about baby.  EPDS is 0.  Amy Valenzuela [facility] 03/13/2021,1814

## 2022-12-25 ENCOUNTER — Telehealth: Payer: BC Managed Care – PPO | Admitting: Family Medicine

## 2022-12-25 DIAGNOSIS — J069 Acute upper respiratory infection, unspecified: Secondary | ICD-10-CM | POA: Diagnosis not present

## 2022-12-25 MED ORDER — FLUTICASONE PROPIONATE 50 MCG/ACT NA SUSP: 2.0000 | Freq: Every day | NASAL | 0 refills | Status: DC

## 2022-12-25 MED ORDER — BENZONATATE 100 MG PO CAPS: 100.0000 mg | ORAL_CAPSULE | Freq: Three times a day (TID) | ORAL | 0 refills | Status: DC | PRN

## 2022-12-25 NOTE — Progress Notes (Signed)

## 2023-04-05 ENCOUNTER — Telehealth: Payer: BC Managed Care – PPO | Admitting: Physician Assistant

## 2023-04-05 DIAGNOSIS — J020 Streptococcal pharyngitis: Secondary | ICD-10-CM

## 2023-04-06 MED ORDER — AMOXICILLIN 500 MG PO CAPS: 500.0000 mg | ORAL_CAPSULE | Freq: Two times a day (BID) | ORAL | 0 refills | Status: AC

## 2023-04-06 NOTE — Progress Notes (Signed)

## 2023-06-13 ENCOUNTER — Telehealth: Payer: BC Managed Care – PPO | Admitting: Family Medicine

## 2023-06-13 DIAGNOSIS — J020 Streptococcal pharyngitis: Secondary | ICD-10-CM | POA: Diagnosis not present

## 2023-06-13 MED ORDER — AMOXICILLIN 500 MG PO CAPS: 500.0000 mg | ORAL_CAPSULE | Freq: Two times a day (BID) | ORAL | 0 refills | Status: AC

## 2023-06-13 NOTE — Progress Notes (Signed)

## 2024-04-18 ENCOUNTER — Telehealth: Admitting: Physician Assistant

## 2024-04-18 DIAGNOSIS — B9689 Other specified bacterial agents as the cause of diseases classified elsewhere: Secondary | ICD-10-CM

## 2024-04-18 DIAGNOSIS — N760 Acute vaginitis: Secondary | ICD-10-CM | POA: Diagnosis not present

## 2024-04-18 MED ORDER — METRONIDAZOLE 500 MG PO TABS: 500.0000 mg | ORAL_TABLET | Freq: Two times a day (BID) | ORAL | 0 refills | Status: AC

## 2024-04-18 NOTE — Progress Notes (Signed)
 E-Visit for Vaginal Symptoms  We are sorry that you are not feeling well. Here is how we plan to help! Based on what you shared with me it looks like you: May have a vaginosis due to bacteria  Vaginosis is an inflammation of the vagina that can result in discharge, itching and pain. The cause is usually a change in the normal balance of vaginal bacteria or an infection. Vaginosis can also result from reduced estrogen levels after menopause.  The most common causes of vaginosis are:   Bacterial vaginosis which results from an overgrowth of one on several organisms that are normally present in your vagina.   Yeast infections which are caused by a naturally occurring fungus called candida.   Vaginal atrophy (atrophic vaginosis) which results from the thinning of the vagina from reduced estrogen levels after menopause.   Trichomoniasis which is caused by a parasite and is commonly transmitted by sexual intercourse.  Factors that increase your risk of developing vaginosis include: Medications, such as antibiotics and steroids Uncontrolled diabetes Use of hygiene products such as bubble bath, vaginal spray or vaginal deodorant Douching Wearing damp or tight-fitting clothing Using an intrauterine device (IUD) for birth control Hormonal changes, such as those associated with pregnancy, birth control pills or menopause Sexual activity Having a sexually transmitted infection  Your treatment plan is Metronidazole or Flagyl 500mg  twice a day for 7 days.  I have electronically sent this prescription into the pharmacy that you have chosen.  Be sure to take all of the medication as directed. Stop taking any medication if you develop a rash, tongue swelling or shortness of breath. Mothers who are breast feeding should consider pumping and discarding their breast milk while on these antibiotics. However, there is no consensus that infant exposure at these doses would be harmful.  Remember that  medication creams can weaken latex condoms. SABRA   HOME CARE:  Good hygiene may prevent some types of vaginosis from recurring and may relieve some symptoms:  Avoid baths, hot tubs and whirlpool spas. Rinse soap from your outer genital area after a shower, and dry the area well to prevent irritation. Don't use scented or harsh soaps, such as those with deodorant or antibacterial action. Avoid irritants. These include scented tampons and pads. Wipe from front to back after using the toilet. Doing so avoids spreading fecal bacteria to your vagina.  Other things that may help prevent vaginosis include:  Don't douche. Your vagina doesn't require cleansing other than normal bathing. Repetitive douching disrupts the normal organisms that reside in the vagina and can actually increase your risk of vaginal infection. Douching won't clear up a vaginal infection. Use a latex condom. Both female and female latex condoms may help you avoid infections spread by sexual contact. Wear cotton underwear. Also wear pantyhose with a cotton crotch. If you feel comfortable without it, skip wearing underwear to bed. Yeast thrives in [employer] Your symptoms should improve in the next day or two.  GET HELP RIGHT AWAY IF:  You have pain in your lower abdomen ( pelvic area or over your ovaries) You develop nausea or vomiting You develop a fever Your discharge changes or worsens You have persistent pain with intercourse You develop shortness of breath, a rapid pulse, or you faint.  These symptoms could be signs of problems or infections that need to be evaluated by a medical provider now.  MAKE SURE YOU   Understand these instructions. Will watch your condition. Will get help right  away if you are not doing well or get worse.  Thank you for choosing an e-visit.  Your e-visit answers were reviewed by a board certified advanced clinical practitioner to complete your personal care plan. Depending upon the  condition, your plan could have included both over the counter or prescription medications.  Please review your pharmacy choice. Make sure the pharmacy is open so you can pick up prescription now. If there is a problem, you may contact your provider through [employer] and have the prescription routed to another pharmacy.  Your safety is important to us . If you have drug allergies check your prescription carefully.   For the next 24 hours you can use MyChart to ask questions about today's visit, request a non-urgent call back, or ask for a work or school excuse. You will get an email in the next two days asking about your experience. I hope that your e-visit has been valuable and will speed your recovery.  I have spent 5 minutes in review of e-visit questionnaire, review and updating patient chart, medical decision making and response to patient.   Amy CHRISTELLA Dickinson, PA-C

## 2024-08-29 ENCOUNTER — Telehealth: Admitting: Physician Assistant

## 2024-08-29 DIAGNOSIS — B9689 Other specified bacterial agents as the cause of diseases classified elsewhere: Secondary | ICD-10-CM

## 2024-08-29 DIAGNOSIS — N760 Acute vaginitis: Secondary | ICD-10-CM | POA: Diagnosis not present

## 2024-08-30 MED ORDER — METRONIDAZOLE 500 MG PO TABS: 500.0000 mg | ORAL_TABLET | Freq: Two times a day (BID) | ORAL | 0 refills | Status: AC

## 2024-08-30 NOTE — Progress Notes (Signed)

## 2024-08-31 ENCOUNTER — Encounter

## 2024-08-31 DIAGNOSIS — N898 Other specified noninflammatory disorders of vagina: Secondary | ICD-10-CM

## 2024-09-06 NOTE — Progress Notes (Signed)
" °  Because you were recently treated for a vaginitis, I feel your condition warrants further evaluation and I recommend that you be seen in a face-to-face visit. I am limited in what I can treat by evisit. I am sorry I cannot help you today.    NOTE: There will be NO CHARGE for this E-Visit   If you are having a true medical emergency, please call 911.     For an urgent face to face visit, [facility] has multiple urgent care centers for your convenience.  Click the link below for the full list of locations and hours, walk-in wait times, appointment scheduling options and driving directions:  Urgent Care - [place], [place], [place], [place], [place], [place]  [place]     Your MyChart E-visit questionnaire answers were reviewed by a board certified advanced clinical practitioner to complete your personal care plan based on your specific symptoms.    Thank you for using e-Visits.    "

## 2024-09-20 ENCOUNTER — Encounter (HOSPITAL_COMMUNITY): Payer: Self-pay | Admitting: Emergency Medicine

## 2024-09-20 ENCOUNTER — Ambulatory Visit (HOSPITAL_COMMUNITY): Admission: EM | Admit: 2024-09-20 | Discharge: 2024-09-20 | Disposition: A | Discharge status: Home / Self Care

## 2024-09-20 DIAGNOSIS — R109 Unspecified abdominal pain: Secondary | ICD-10-CM | POA: Diagnosis not present

## 2024-09-20 DIAGNOSIS — R519 Headache, unspecified: Secondary | ICD-10-CM

## 2024-09-20 DIAGNOSIS — R319 Hematuria, unspecified: Secondary | ICD-10-CM

## 2024-09-20 DIAGNOSIS — R10A2 Flank pain, left side: Secondary | ICD-10-CM | POA: Diagnosis not present

## 2024-09-20 LAB — COMPREHENSIVE METABOLIC PANEL WITH GFR
ALT: 24 U/L (ref 0–44)
AST: 28 U/L (ref 15–41)
Albumin: 3.7 g/dL (ref 3.5–5.0)
Alkaline Phosphatase: 51 U/L (ref 38–126)
Anion gap: 14 (ref 5–15)
BUN: 9 mg/dL (ref 6–20)
CO2: 24 mmol/L (ref 22–32)
Calcium: 8.8 mg/dL — ABNORMAL LOW (ref 8.9–10.3)
Chloride: 101 mmol/L (ref 98–111)
Creatinine, Ser: 0.69 mg/dL (ref 0.44–1.00)
GFR, Estimated: >60 mL/min
Glucose, Bld: 80 mg/dL (ref 70–99)
Potassium: 3.6 mmol/L (ref 3.5–5.1)
Sodium: 139 mmol/L (ref 135–145)
Total Bilirubin: 0.3 mg/dL (ref 0.0–1.2)
Total Protein: 7.3 g/dL (ref 6.5–8.1)

## 2024-09-20 LAB — CBC WITH DIFFERENTIAL/PLATELET
Abs Immature Granulocytes: 0.03 K/uL (ref 0.00–0.07)
Basophils Absolute: 0 K/uL (ref 0.0–0.1)
Basophils Relative: 1 %
Eosinophils Absolute: 0.3 K/uL (ref 0.0–0.5)
Eosinophils Relative: 4 %
HCT: 32.4 % — ABNORMAL LOW (ref 36.0–46.0)
Hemoglobin: 11 g/dL — ABNORMAL LOW (ref 12.0–15.0)
Immature Granulocytes: 0 %
Lymphocytes Relative: 26 %
Lymphs Abs: 2.2 K/uL (ref 0.7–4.0)
MCH: 28 pg (ref 26.0–34.0)
MCHC: 34 g/dL (ref 30.0–36.0)
MCV: 82.4 fL (ref 80.0–100.0)
Monocytes Absolute: 1 K/uL (ref 0.1–1.0)
Monocytes Relative: 13 %
Neutro Abs: 4.6 K/uL (ref 1.7–7.7)
Neutrophils Relative %: 56 %
Platelets: 329 K/uL (ref 150–400)
RBC: 3.93 MIL/uL (ref 3.87–5.11)
RDW: 12.8 % (ref 11.5–15.5)
WBC: 8.2 K/uL (ref 4.0–10.5)
nRBC: 0 % (ref 0.0–0.2)

## 2024-09-20 LAB — POCT URINE DIPSTICK
Bilirubin, UA: NEGATIVE
Glucose, UA: NEGATIVE mg/dL
Ketones, POC UA: NEGATIVE mg/dL
Nitrite, UA: NEGATIVE
Spec Grav, UA: 1.01
Urobilinogen, UA: 0.2 U/dL
pH, UA: 6

## 2024-09-20 LAB — POCT URINE PREGNANCY: Preg Test, Ur: NEGATIVE

## 2024-09-20 MED ORDER — TAMSULOSIN HCL 0.4 MG PO CAPS: 0.4000 mg | ORAL_CAPSULE | Freq: Every day | ORAL | 0 refills | Status: AC

## 2024-09-20 MED ORDER — IBUPROFEN 800 MG PO TABS: 800.0000 mg | ORAL_TABLET | Freq: Three times a day (TID) | ORAL | 0 refills | Status: AC

## 2024-09-20 NOTE — Medical Student Note (Signed)
 Engineer, Site Note For educational purposes for Medical, PA and NP students only and not part of the legal medical record.   CSN: [id number] Arrival date & time: 09/20/24  1720      History   Chief Complaint Chief Complaint  Patient presents with   Headache   Abdominal Pain    HPI Amy Valenzuela is a 29 y.o. female.  Pt with a non-contributory medical history presents with L flank pain and a HA. The HA started last Thursday (1/8) and lasted 3 days. It went away for a day and then returned. It is frontal and described as pulsating. She denies photo/phonophobia, N/V, or blurred vision. She has tried taking ibuprofen  without relief. She reports she does not have the HA at this time.   The L flank pain started yesterday and does not radiate. This AM she reported that her urine was foamy, dark, and had blood in it. She denies any dysuria, frequency, or hesitancy. She does report that she is not going as often as she normally does. She denies fever and reports she checked it at home and it was 98. She denies recent ETOH use and she drinks 2 stanley cups full of water  daily and does not drink sugar sweetened or caffeinated beverages. Her last BM was today and described as normal.   The history is provided by the patient.  Headache Associated symptoms: abdominal pain   Associated symptoms: no congestion, no diarrhea, no fatigue, no fever, no myalgias, no nausea, no photophobia, no sinus pressure and no vomiting   Abdominal Pain Associated symptoms: hematuria   Associated symptoms: no chills, no constipation, no diarrhea, no dysuria, no fatigue, no fever, no nausea and no vomiting     Past Medical History:  Diagnosis Date   Diabetes mellitus without complication (HCC)    Gestational diabetes     Patient Active Problem List   Diagnosis Date Noted   Gestational hypertension, antepartum 03/07/2021   Postpartum care following cesarean delivery 03/02/2021    Breech birth 03/01/2021   S/P cesarean section 03/01/2021   Gestational diabetes mellitus (GDM), antepartum 01/18/2021   COVID-19 affecting pregnancy in first trimester 09/05/2020    Past Surgical History:  Procedure Laterality Date   CESAREAN SECTION N/A 03/01/2021   Procedure: CESAREAN SECTION;  Surgeon: Armond Cape, MD;  Location: [facility];  Service: Obstetrics;  Laterality: N/A;   MOUTH SURGERY     WISDOM TOOTH EXTRACTION      OB History     Gravida  1   Para  1   Term  1   Preterm      AB      Living  1      SAB      IAB      Ectopic      Multiple  0   Live Births  1            Home Medications    Prior to Admission medications  Medication Sig Start Date End Date Taking? Authorizing Provider  norethindrone (MICRONOR) 0.35 MG tablet Take 1 tablet by mouth daily.    [provider]    Family History Family History  Problem Relation Age of Onset   Hypertension Father     Social History Social History[1]   Allergies   Patient has no known allergies.   Review of Systems Review of Systems  Constitutional:  Negative for chills, fatigue and fever.  HENT:  Negative for congestion, rhinorrhea and sinus pressure.   Eyes:  Negative for photophobia and visual disturbance.  Gastrointestinal:  Positive for abdominal pain. Negative for constipation, diarrhea, nausea and vomiting.  Genitourinary:  Positive for decreased urine volume and hematuria. Negative for dysuria, frequency and urgency.  Musculoskeletal:  Negative for myalgias.  Neurological:  Positive for headaches.     Physical Exam Updated Vital Signs BP 121/81   Pulse 85   Temp 98.9 F (37.2 C) (Oral)   Resp 16   LMP 09/09/2024 (Exact Date)   SpO2 96%   Breastfeeding No   Physical Exam Constitutional:      Appearance: Normal appearance. She is not ill-appearing.  HENT:     Nose: Nose normal.     Right Sinus: No maxillary sinus tenderness or frontal sinus  tenderness.     Left Sinus: No maxillary sinus tenderness or frontal sinus tenderness.  Cardiovascular:     Rate and Rhythm: Normal rate and regular rhythm.     Heart sounds: Normal heart sounds. No murmur heard.    No friction rub. No gallop.  Pulmonary:     Effort: Pulmonary effort is normal.     Breath sounds: Normal breath sounds.  Abdominal:     General: Bowel sounds are normal.     Palpations: Abdomen is soft.     Tenderness: There is abdominal tenderness (mild tenderness with LUQ and LLQ). There is left CVA tenderness. There is no right CVA tenderness, guarding or rebound.  Skin:    General: Skin is warm and dry.  Neurological:     Mental Status: She is alert and oriented to person, place, and time.  Psychiatric:        Mood and Affect: Mood normal.        Behavior: Behavior normal.      ED Treatments / Results  Labs (all labs ordered are listed, but only abnormal results are displayed) Labs Reviewed  POCT URINE DIPSTICK - Abnormal; Notable for the following components:      Result Value   Clarity, UA cloudy (*)    Blood, UA trace-lysed (*)    Protein Ur, POC trace (*)    Leukocytes, UA Small (1+) (*)    All other components within normal limits  POCT URINE PREGNANCY - Normal  URINE CULTURE  COMPREHENSIVE METABOLIC PANEL WITH GFR  CBC WITH DIFFERENTIAL/PLATELET    EKG  Radiology No results found.  Procedures Procedures (including critical care time)  Medications Ordered in ED Medications - No data to display   Initial Impression / Assessment and Plan / ED Course  I have reviewed the triage vital signs and the nursing notes.  Pertinent labs & imaging results that were available during my care of the patient were reviewed by me and considered in my medical decision making (see chart for details).    POC urine dip showed trace RBC, trace protein, and small leuks. Upreg is negative.   Flank pain  Symptoms concerning for possible nephrolithiasis. Other  considerations for infection and renal pathology given. Will order CBC w/diff, CMP, and urine culture with results pending. Will update plan of care if needed. Given the lack of advanced imaging in the urgent care setting we will treat empirically medical expulsive therapy for nephrolithiasis. Start Flomax  0.4 mg daily and ibuprofen  800 mg three times a day prn for pain. Counseled patient on increasing fluid intake. Urine strainer provided. Strict ER precautions given to present to ED if she has sudden  increase in pain or if she develops fever.   Red flag symptoms reviewed and return precautions given.    Final Clinical Impressions(s) / ED Diagnoses   Final diagnoses:  Hematuria, unspecified type    New Prescriptions New Prescriptions   No medications on file       [1]  Social History Tobacco Use   Smoking status: Never   Smokeless tobacco: Never  Vaping Use   Vaping status: Never Used  Substance Use Topics   Alcohol use: No   Drug use: Never   "

## 2024-09-20 NOTE — ED Triage Notes (Signed)
 Patient has had a headache since Thursday and patient is having abdominal and flank pain that started 3 days ago.  When patient urinated yesterday she stated  it had blood and foam in it  since then it has cleared up so she wanted to be check for it.  Patient took 800 mg of ibphofen and headache went away however it has came back.

## 2024-09-20 NOTE — ED Provider Notes (Signed)
 " [facility]    CSN: [id number] Arrival date & time: 09/20/24  1720      History   Chief Complaint Chief Complaint  Patient presents with   Headache   Abdominal Pain    HPI Amy Valenzuela is a 29 y.o. female.   Patient presents to clinic over concern of intermittent headache, hematuria and foamy urine.  Patient had a severe headache on Thursday, 6 days ago.  It lasted for a few days and then went away and then returned the past few days.  She does not currently have a headache.  Has been taking 800 mg of ibuprofen  for this as needed.  Has not had photophobia or phonophobia.  Without nausea or vomiting.  No recent illness.  Denies nasal congestion or rhinorrhea.  Patient also concerned over intermittent hematuria that she noticed yesterday.  She did noticed her urine was foamy yesterday as well.  Is having some left-sided flank pain.  Potential kidney stone in early childhood, never diagnosed.  Denies urinary frequency or urgency.  If anything she is urinating less than usual.  The history is provided by the patient and medical records.  Headache Abdominal Pain   Past Medical History:  Diagnosis Date   Diabetes mellitus without complication (HCC)    Gestational diabetes     Patient Active Problem List   Diagnosis Date Noted   Gestational hypertension, antepartum 03/07/2021   Postpartum care following cesarean delivery 03/02/2021   Breech birth 03/01/2021   S/P cesarean section 03/01/2021   Gestational diabetes mellitus (GDM), antepartum 01/18/2021   COVID-19 affecting pregnancy in first trimester 09/05/2020    Past Surgical History:  Procedure Laterality Date   CESAREAN SECTION N/A 03/01/2021   Procedure: CESAREAN SECTION;  Surgeon: Armond Cape, MD;  Location: [facility];  Service: Obstetrics;  Laterality: N/A;   MOUTH SURGERY     WISDOM TOOTH EXTRACTION      OB History     Gravida  1   Para  1   Term  1   Preterm      AB      Living  1       SAB      IAB      Ectopic      Multiple  0   Live Births  1            Home Medications    Prior to Admission medications  Medication Sig Start Date End Date Taking? Authorizing Provider  ibuprofen  (ADVIL ) 800 MG tablet Take 1 tablet (800 mg total) by mouth 3 (three) times daily. 09/20/24  Yes Ball, Jorian [place]  G, FNP  tamsulosin  (FLOMAX ) 0.4 MG CAPS capsule Take 1 capsule (0.4 mg total) by mouth daily after supper for 10 days. 09/20/24 09/30/24 Yes Ball, Le Fer[place]  G, FNP  norethindrone (MICRONOR) 0.35 MG tablet Take 1 tablet by mouth daily.    [provider]    Family History Family History  Problem Relation Age of Onset   Hypertension Father     Social History Social History[1]   Allergies   Patient has no known allergies.   Review of Systems Review of Systems  Per HPI  Physical Exam Triage Vital Signs ED Triage Vitals  Encounter Vitals Group     BP 09/20/24 1842 121/81     Girls Systolic BP Percentile --      Girls Diastolic BP Percentile --      Boys Systolic BP Percentile --  Boys Diastolic BP Percentile --      Pulse Rate 09/20/24 1842 85     Resp 09/20/24 1842 16     Temp 09/20/24 1842 98.9 F (37.2 C)     Temp Source 09/20/24 1842 Oral     SpO2 09/20/24 1842 96 %     Weight --      Height --      Head Circumference --      Peak Flow --      Pain Score 09/20/24 1840 7     Pain Loc --      Pain Education --      Exclude from Growth Chart --    No data found.  Updated Vital Signs BP 121/81   Pulse 85   Temp 98.9 F (37.2 C) (Oral)   Resp 16   LMP 09/09/2024 (Exact Date)   SpO2 96%   Breastfeeding No   Visual Acuity Right Eye Distance:   Left Eye Distance:   Bilateral Distance:    Right Eye Near:   Left Eye Near:    Bilateral Near:     Physical Exam Vitals and nursing note reviewed.  Constitutional:      Appearance: She is well-developed.  HENT:     Head: Normocephalic and atraumatic.     Right Ear: External  ear normal.     Left Ear: External ear normal.     Nose: Nose normal.     Mouth/Throat:     Mouth: Mucous membranes are moist.  Eyes:     General:        Right eye: No discharge.        Left eye: No discharge.     Conjunctiva/sclera: Conjunctivae normal.     Pupils: Pupils are equal, round, and reactive to light.  Cardiovascular:     Rate and Rhythm: Normal rate.  Pulmonary:     Effort: Pulmonary effort is normal. No respiratory distress.  Abdominal:     Tenderness: There is left CVA tenderness. There is no right CVA tenderness.  Neurological:     General: No focal deficit present.     Mental Status: She is alert.  Psychiatric:        Mood and Affect: Mood normal.      UC Treatments / Results  Labs (all labs ordered are listed, but only abnormal results are displayed) Labs Reviewed  POCT URINE DIPSTICK - Abnormal; Notable for the following components:      Result Value   Clarity, UA cloudy (*)    Blood, UA trace-lysed (*)    Protein Ur, POC trace (*)    Leukocytes, UA Small (1+) (*)    All other components within normal limits  POCT URINE PREGNANCY - Normal  URINE CULTURE  COMPREHENSIVE METABOLIC PANEL WITH GFR  CBC WITH DIFFERENTIAL/PLATELET    EKG   Radiology No results found.  Procedures Procedures (including critical care time)  Medications Ordered in UC Medications - No data to display  Initial Impression / Assessment and Plan / UC Course  I have reviewed the triage vital signs and the nursing notes.  Pertinent labs & imaging results that were available during my care of the patient were reviewed by me and considered in my medical decision making (see chart for details).  Vitals and triage reviewed, patient is hemodynamically stable.  Left-sided flank pain, left-sided CVA tenderness, trace red blood cells, trace protein and trace leukocytes on UA.  Will send for culture to  ensure no UTI.  Discussed limitations of advanced imaging available at urgent  care and discussed that presentation could be consistent with nephrolithiasis.  Will treat with Flomax .  Did offer IM Toradol , patient declined.  Urine pregnancy was negative.  Does have protein in urine and did have foamy urine.  Will check CBC and CMP to ensure normal renal function among other labs.  Pain management discussed.  No current headache.  Without red flag symptoms of the headache.  Strict emergency precautions given if symptoms evolve or worsen.  Encouraged to strain all urine and follow-up with urology.  Plan of care, follow-up care and return precautions given, no questions at this time.    Final Clinical Impressions(s) / UC Diagnoses   Final diagnoses:  Hematuria, unspecified type  Left flank pain  Bad headache     Discharge Instructions      I am suspicious of a kidney stone.  Take the Flomax  daily after supper and strain all your urine.  Ensure you are drinking at least 64 ounces of water  daily to help flush the kidneys.  For pain you can take 800 milligrams of ibuprofen  every 8 hours as needed.  This may help your headache as well.  Consider following up with urology if urinary symptoms persist.  We have checked some basic labs and we will contact you if urgent follow-up is needed in the morning.  For any severe pain, fever, or inability to pass urine seek immediate care at the nearest emergency department for further evaluation.       ED Prescriptions     Medication Sig Dispense Auth. Provider   tamsulosin  (FLOMAX ) 0.4 MG CAPS capsule Take 1 capsule (0.4 mg total) by mouth daily after supper for 10 days. 10 capsule Ball, Amorah [place]  G, FNP   ibuprofen  (ADVIL ) 800 MG tablet Take 1 tablet (800 mg total) by mouth 3 (three) times daily. 30 tablet Ball, Lorah Kalina  G, FNP      PDMP not reviewed this encounter.    [1]  Social History Tobacco Use   Smoking status: Never   Smokeless tobacco: Never  Vaping Use   Vaping status: Never Used  Substance Use Topics    Alcohol use: No   Drug use: Never     Mercer Mallie MATSU, FNP 09/20/24 1942  "

## 2024-09-20 NOTE — Discharge Instructions (Signed)
 I am suspicious of a kidney stone.  Take the Flomax  daily after supper and strain all your urine.  Ensure you are drinking at least 64 ounces of water  daily to help flush the kidneys.  For pain you can take 800 milligrams of ibuprofen  every 8 hours as needed.  This may help your headache as well.  Consider following up with urology if urinary symptoms persist.  We have checked some basic labs and we will contact you if urgent follow-up is needed in the morning.  For any severe pain, fever, or inability to pass urine seek immediate care at the nearest emergency department for further evaluation.

## 2024-09-21 ENCOUNTER — Ambulatory Visit (HOSPITAL_COMMUNITY): Payer: Self-pay

## 2024-09-22 LAB — URINE CULTURE: Culture: >=100000 — AB

## 2024-09-22 MED ORDER — CIPROFLOXACIN HCL 500 MG PO TABS: 500.0000 mg | ORAL_TABLET | Freq: Two times a day (BID) | ORAL | 0 refills | Status: AC
